# Patient Record
Sex: Male | Born: 1960
Health system: Southern US, Community
[De-identification: ages and names within clinical notes are randomized; demographics above are authoritative.]

## PROBLEM LIST (undated history)

## (undated) DIAGNOSIS — I10 Essential (primary) hypertension: Secondary | ICD-10-CM

---

## 2005-07-11 ENCOUNTER — Ambulatory Visit: Payer: Self-pay | Admitting: Family Medicine

## 2007-04-20 DIAGNOSIS — R519 Headache, unspecified: Secondary | ICD-10-CM | POA: Insufficient documentation

## 2007-04-20 DIAGNOSIS — E785 Hyperlipidemia, unspecified: Secondary | ICD-10-CM | POA: Insufficient documentation

## 2007-04-20 DIAGNOSIS — R51 Headache: Secondary | ICD-10-CM | POA: Insufficient documentation

## 2007-04-20 DIAGNOSIS — I10 Essential (primary) hypertension: Secondary | ICD-10-CM | POA: Insufficient documentation

## 2012-08-24 ENCOUNTER — Encounter (HOSPITAL_COMMUNITY): Payer: Self-pay | Admitting: *Deleted

## 2012-08-24 ENCOUNTER — Emergency Department (HOSPITAL_COMMUNITY)
Admission: EM | Admit: 2012-08-24 | Discharge: 2012-08-24 | Disposition: A | Discharge status: Home / Self Care | Payer: Medicaid Other | Source: Home / Self Care | Attending: Emergency Medicine | Admitting: Emergency Medicine

## 2012-08-24 DIAGNOSIS — R51 Headache: Secondary | ICD-10-CM

## 2012-08-24 DIAGNOSIS — I10 Essential (primary) hypertension: Secondary | ICD-10-CM

## 2012-08-24 HISTORY — DX: Essential (primary) hypertension: I10

## 2012-08-24 MED ORDER — LISINOPRIL-HYDROCHLOROTHIAZIDE 20-12.5 MG PO TABS: 1.0000 | ORAL_TABLET | Freq: Every day | ORAL | Status: DC

## 2012-08-24 NOTE — ED Provider Notes (Signed)
Medical screening examination/treatment/procedure(s) were performed by non-physician practitioner and as supervising physician I was immediately available for consultation/collaboration.  Leslee Home, M.D.   Reuben Likes, MD 08/24/12 2136

## 2012-08-24 NOTE — ED Notes (Signed)
Pt  Reports  A  Headache      He  Says    He      Has  High  Blood  Pressure  And  Has  Not  Taken  Any meds  In over  1  Year  -  As  A  Matter  Of fact he  Says  He  Has  Not seen a  Dr  In  Over  1  Year         He  Reports  Some  Nausea   He  Is  Awake  And  Alert    pearla     Ambulated  With a  Steady  Fluid  Gait

## 2012-08-24 NOTE — ED Provider Notes (Signed)
History     CSN: [id number]  Arrival date & time 08/24/12  1043   First MD Initiated Contact with Patient 08/24/12 1302      Chief Complaint  Patient presents with  . Headache    (Consider location/radiation/quality/duration/timing/severity/associated sxs/prior treatment) HPI Comments: Pt reports hx of htn for several years, untreated for many years. Gets headaches occasionally that pt believes are related to his untreated htn, most recent started 3 days ago and is worse than usual. Has found a pcp but can't get an appt until beginning of March.  Here requesting bp meds.  Mother with hx htn, died at age 24. No other family hx heart disease, stroke, htn. Has felt occasionally slightly dizzy with this headache, none now, and occasionally mild nausea, no vomiting.   Patient is a 52 y.o. male presenting with headaches. The history is provided by the patient.  Headache Pain location:  Generalized Quality:  Dull Radiates to:  Does not radiate Severity currently:  6/10 Onset quality:  Gradual Duration:  3 days Timing:  Constant Chronicity:  Recurrent Similar to prior headaches: yes   Relieved by:  Nothing Worsened by:  Nothing tried Associated symptoms: dizziness and nausea   Associated symptoms: no blurred vision, no congestion, no ear pain, no fever, no near-syncope, no neck pain, no neck stiffness, no numbness, no photophobia, no sinus pressure, no sore throat, no swollen glands, no syncope, no URI, no visual change, no vomiting and no weakness     Past Medical History  Diagnosis Date  . Hypertension     History reviewed. No pertinent past surgical history.  History reviewed. No pertinent family history.  History  Substance Use Topics  . Smoking status: Never Smoker   . Smokeless tobacco: Not on file  . Alcohol Use: No      Review of Systems  Constitutional: Negative for fever and chills.  HENT: Negative for ear pain, congestion, sore throat, neck pain, neck  stiffness and sinus pressure.   Eyes: Negative for blurred vision, photophobia and visual disturbance.  Respiratory: Negative for chest tightness and shortness of breath.   Cardiovascular: Negative for chest pain, syncope and near-syncope.  Gastrointestinal: Positive for nausea. Negative for vomiting.  Neurological: Positive for dizziness and headaches. Negative for syncope, speech difficulty, weakness and numbness.  Hematological: Does not bruise/bleed easily.  Psychiatric/Behavioral: Negative for confusion.    Allergies  Review of patient's allergies indicates no known allergies.  Home Medications   Current Outpatient Rx  Name  Route  Sig  Dispense  Refill  . lisinopril-hydrochlorothiazide (PRINZIDE,ZESTORETIC) 20-12.5 MG per tablet   Oral   Take 1 tablet by mouth daily.   30 tablet   1     BP 190/124  Pulse 76  Temp(Src) 98.6 F (37 C) (Oral)  SpO2 100%  Physical Exam  Constitutional: He is oriented to person, place, and time. He appears well-developed and well-nourished. No distress.  Neck: Normal range of motion. Neck supple. Normal carotid pulses and no JVD present. Carotid bruit is not present.  Cardiovascular: Normal rate and regular rhythm.  PMI is not displaced.   Pulses:      Radial pulses are 2+ on the right side, and 2+ on the left side.  No peripheral edema  Pulmonary/Chest: Effort normal and breath sounds normal.  Neurological: He is alert and oriented to person, place, and time. Coordination and gait normal.    ED Course  Procedures (including critical care time)  Labs Reviewed - No  data to display No results found.   1. Hypertension   2. Headache       MDM  Discussed signs/sx of stroke and MI with pt; pt to seek emergent help if these sx occur. Pt to f/u with Dr. Parke Simmers as scheduled.         Cathlyn Parsons, NP 08/24/12 1311

## 2014-11-18 ENCOUNTER — Encounter (HOSPITAL_COMMUNITY): Payer: Self-pay | Admitting: Emergency Medicine

## 2014-11-18 ENCOUNTER — Emergency Department (INDEPENDENT_AMBULATORY_CARE_PROVIDER_SITE_OTHER): Payer: Medicaid Other

## 2014-11-18 ENCOUNTER — Emergency Department (HOSPITAL_COMMUNITY)
Admission: EM | Admit: 2014-11-18 | Discharge: 2014-11-18 | Disposition: A | Discharge status: Home / Self Care | Payer: Medicaid Other | Attending: Emergency Medicine | Admitting: Emergency Medicine

## 2014-11-18 ENCOUNTER — Encounter (HOSPITAL_COMMUNITY): Payer: Self-pay | Admitting: *Deleted

## 2014-11-18 ENCOUNTER — Emergency Department (INDEPENDENT_AMBULATORY_CARE_PROVIDER_SITE_OTHER)
Admission: EM | Admit: 2014-11-18 | Discharge: 2014-11-18 | Disposition: A | Discharge status: Nursing Home (Includes ICF) | Payer: Medicaid Other | Source: Home / Self Care | Attending: Family Medicine | Admitting: Family Medicine

## 2014-11-18 DIAGNOSIS — Y92000 Kitchen of unspecified non-institutional (private) residence as  the place of occurrence of the external cause: Secondary | ICD-10-CM | POA: Diagnosis not present

## 2014-11-18 DIAGNOSIS — Y999 Unspecified external cause status: Secondary | ICD-10-CM | POA: Insufficient documentation

## 2014-11-18 DIAGNOSIS — Z23 Encounter for immunization: Secondary | ICD-10-CM

## 2014-11-18 DIAGNOSIS — S90851A Superficial foreign body, right foot, initial encounter: Secondary | ICD-10-CM

## 2014-11-18 DIAGNOSIS — S90852A Superficial foreign body, left foot, initial encounter: Secondary | ICD-10-CM | POA: Insufficient documentation

## 2014-11-18 DIAGNOSIS — Z79899 Other long term (current) drug therapy: Secondary | ICD-10-CM | POA: Insufficient documentation

## 2014-11-18 DIAGNOSIS — S99922A Unspecified injury of left foot, initial encounter: Secondary | ICD-10-CM | POA: Diagnosis present

## 2014-11-18 DIAGNOSIS — W458XXA Other foreign body or object entering through skin, initial encounter: Secondary | ICD-10-CM | POA: Diagnosis not present

## 2014-11-18 DIAGNOSIS — I10 Essential (primary) hypertension: Secondary | ICD-10-CM | POA: Insufficient documentation

## 2014-11-18 DIAGNOSIS — M795 Residual foreign body in soft tissue: Secondary | ICD-10-CM

## 2014-11-18 DIAGNOSIS — Y939 Activity, unspecified: Secondary | ICD-10-CM | POA: Insufficient documentation

## 2014-11-18 MED ORDER — HYDROCODONE-ACETAMINOPHEN 5-325 MG PO TABS
1.0000 | ORAL_TABLET | Freq: Four times a day (QID) | ORAL | Status: DC | PRN
Start: 2014-11-18 — End: 2015-01-02

## 2014-11-18 MED ORDER — LIDOCAINE HCL 2 % IJ SOLN
20.0000 mL | Freq: Once | INTRAMUSCULAR | Status: AC
  Administered 2014-11-18: 400 mg via INTRADERMAL
  Filled 2014-11-18: qty 20

## 2014-11-18 MED ORDER — AMOXICILLIN-POT CLAVULANATE 875-125 MG PO TABS: 1.0000 | ORAL_TABLET | Freq: Two times a day (BID) | ORAL | Status: DC

## 2014-11-18 MED ORDER — CIPROFLOXACIN HCL 500 MG PO TABS: 500.0000 mg | ORAL_TABLET | Freq: Two times a day (BID) | ORAL | Status: DC

## 2014-11-18 MED ORDER — TETANUS-DIPHTH-ACELL PERTUSSIS 5-2.5-18.5 LF-MCG/0.5 IM SUSP
0.5000 mL | Freq: Once | INTRAMUSCULAR | Status: AC
  Administered 2014-11-18: 0.5 mL via INTRAMUSCULAR

## 2014-11-18 MED ORDER — TETANUS-DIPHTH-ACELL PERTUSSIS 5-2.5-18.5 LF-MCG/0.5 IM SUSP
INTRAMUSCULAR | Status: AC
  Filled 2014-11-18: qty 0.5

## 2014-11-18 NOTE — ED Provider Notes (Signed)
CSN: [id number]     Arrival date & time 11/18/14  2010 History  This chart was scribed for non-physician practitioner Ebbie Ridgehris Kiaya Haliburton, PA, working with Samuel JesterKathleen McManus, DO, by Tanda RockersMargaux Venter, ED Scribe. This patient was seen in room TR07C/TR07C and the patient's care was started at 8:43 PM.    Chief Complaint  Patient presents with  . Foot Injury   The history is provided by the patient. No language interpreter was used.     HPI Comments: Thomas Avila is a 54 y.o. male who presents to the Emergency Department complaining of left foot injury that occurred 2 days ago. He states that he stepped on something sharp and tried to pull it out himself but was unable to do so. Pt was seen at urgent care earlier today and had an X ray done, showing a portion of a needle below the calcaneous. He admits to increased pain to the area. Pt denies any other symptoms.    Past Medical History  Diagnosis Date  . Hypertension    History reviewed. No pertinent past surgical history. No family history on file. History  Substance Use Topics  . Smoking status: Never Smoker   . Smokeless tobacco: Not on file  . Alcohol Use: Yes     Comment: occasionally    Review of Systems  A complete 10 system review of systems was obtained and all systems are negative except as noted in the HPI and PMH.     Allergies  Review of patient's allergies indicates no known allergies.  Home Medications   Prior to Admission medications   Medication Sig Start Date End Date Taking? Authorizing Provider  lisinopril-hydrochlorothiazide (PRINZIDE,ZESTORETIC) 20-12.5 MG per tablet Take 1 tablet by mouth daily. 08/24/12   Cathlyn ParsonsAngela M Kabbe, NP   Triage Vitals: BP 168/122 mmHg  Pulse 67  Temp(Src) 98 F (36.7 C) (Oral)  Resp 16  Ht 5\' 8"  (1.727 m)  Wt 182 lb (82.555 kg)  BMI 27.68 kg/m2  SpO2 99%   Physical Exam  Constitutional: He is oriented to person, place, and time. He appears well-developed and well-nourished. No  distress.  HENT:  Head: Normocephalic and atraumatic.  Pulmonary/Chest: Effort normal.  Musculoskeletal: Normal range of motion.  Small puncture wound mid calcaneous region.   Neurological: He is alert and oriented to person, place, and time.  Skin: Skin is warm and dry.  Psychiatric: He has a normal mood and affect. His behavior is normal.  Nursing note and vitals reviewed.   ED Course  FOREIGN BODY REMOVAL Date/Time: 11/20/2014 12:42 AM Performed by: Charlestine NightLAWYER, Noura Purpura Authorized by: Charlestine NightLAWYER, [place] Consent: Verbal consent obtained. Written consent not obtained. Risks and benefits: risks, benefits and alternatives were discussed Consent given by: patient Patient understanding: patient states understanding of the procedure being performed Patient consent: the patient's understanding of the procedure matches consent given Procedure consent: procedure consent matches procedure scheduled Relevant documents: relevant documents present and verified Test results: test results available and properly labeled Site marked: the operative site was marked Imaging studies: imaging studies available Patient identity confirmed: verbally with patient Time out: Immediately prior to procedure a "time out" was called to verify the correct patient, procedure, equipment, support staff and site/side marked as required. Body area: skin General location: lower extremity Location details: left foot Anesthesia: local infiltration Local anesthetic: lidocaine 2% without epinephrine Anesthetic total: 8 ml Patient sedated: no Patient restrained: no Patient cooperative: yes Localization method: C-arm Removal mechanism: alligator forceps, scalpel, forceps and hemostat Dressing:  dressing applied and antibiotic ointment Tendon involvement: none Depth: deep Complexity: complex 1 objects recovered. Objects recovered: Sewing needle Post-procedure assessment: foreign body removed Patient tolerance:  Patient tolerated the procedure well with no immediate complications Comments: The patient was prepped and cleaned in a sterile fashion.  The wound was then copiously irrigated following the removal of the needle.  Patient was placed on antibiotics   (including critical care time)  DIAGNOSTIC STUDIES: Oxygen Saturation is 99% on RA, normal by my interpretation.    COORDINATION OF CARE: 8:46 PM-Discussed treatment plan which includes foreign body removal with pt at bedside and pt agreed to plan.   Imaging Review Dg Foot Complete Left  11/18/2014   CLINICAL DATA:  Per pt: stepped on fish bone Wednesday. Tried to take it out, couldn't get it. Injury is to the left foot. 2 AP left foot performed, one with a metallic BB marker of wound entry, second without. Patient is not a diabetic  EXAM: LEFT FOOT - COMPLETE 3+ VIEW  COMPARISON:  None.  FINDINGS: There is no evidence of a fish bone. However, there is a portion of a needle within the soft tissues of the posterior plantar foot, just below the body of the calcaneus.  No other radiopaque foreign body.  No soft tissue air.  No fracture. No evidence of osteomyelitis. Joints are normally spaced and aligned. There is a small marginal erosion of the lateral head of the fifth metacarpal. Consider gout in the proper clinical setting. Minor spurring is noted at the first metatarsophalangeal joint. No other arthropathic change.  IMPRESSION: 1. No fracture.  No evidence of osteomyelitis. 2. Portion of a needle lies within the posterior plantar soft tissues below the body of the calcaneus. No other radiopaque foreign body.   Electronically Signed   By: Amie Portlandavid  Ormond M.D.   On: 11/18/2014 18:10   patient was placed on antibiotics.  Told to keep the area clean and dry.  Advised to soak in warm water with peroxide 3 times a day.  Told to here for any worsening in his condition such infection I personally performed the services described in this documentation, which was  scribed in my presence. The recorded information has been reviewed and is accurate.    Charlestine Nighthristopher Shaheen Star, PA-C 11/20/14 0046  Samuel JesterKathleen McManus, DO 11/20/14 1615

## 2014-11-18 NOTE — ED Provider Notes (Signed)
CSN: [id number]     Arrival date & time 11/18/14  1608 History   First MD Initiated Contact with Patient 11/18/14 1708     Chief Complaint  Patient presents with  . Foot Injury   (Consider location/radiation/quality/duration/timing/severity/associated sxs/prior Treatment) Patient is a 54 y.o. male presenting with foot injury. The history is provided by the patient.  Foot Injury Location:  Foot Time since incident:  2 days Injury: yes   Mechanism of injury comment:  Concerned with stepping on fishbone to heel area, still sore to walk on it. Foot location:  Sole of L foot Pain details:    Radiates to:  Does not radiate   Severity:  Moderate   Progression:  Worsening Chronicity:  New Dislocation: no   Associated symptoms: no decreased ROM and no fever     Past Medical History  Diagnosis Date  . Hypertension    History reviewed. No pertinent past surgical history. History reviewed. No pertinent family history. History  Substance Use Topics  . Smoking status: Never Smoker   . Smokeless tobacco: Not on file  . Alcohol Use: Yes     Comment: occasionally    Review of Systems  Constitutional: Negative.  Negative for fever.  Musculoskeletal: Positive for gait problem.  Skin: Positive for wound.    Allergies  Review of patient's allergies indicates no known allergies.  Home Medications   Prior to Admission medications   Medication Sig Start Date End Date Taking? Authorizing Provider  lisinopril-hydrochlorothiazide (PRINZIDE,ZESTORETIC) 20-12.5 MG per tablet Take 1 tablet by mouth daily. 08/24/12   Cathlyn ParsonsAngela M Kabbe, NP   BP 171/118 mmHg  Pulse 70  Temp(Src) 98.2 F (36.8 C) (Oral)  Resp 16  SpO2 97% Physical Exam  Constitutional: He is oriented to person, place, and time. He appears well-developed and well-nourished. No distress.  Musculoskeletal: He exhibits tenderness.  Puncture site evident mid calcaneas. No erythema, no sts evident.  Neurological: He is alert and  oriented to person, place, and time.  Skin: Skin is warm and dry. No rash noted.  Nursing note and vitals reviewed.   ED Course  Procedures (including critical care time) Labs Review Labs Reviewed - No data to display  Imaging Review Dg Foot Complete Left  11/18/2014   CLINICAL DATA:  Per pt: stepped on fish bone Wednesday. Tried to take it out, couldn't get it. Injury is to the left foot. 2 AP left foot performed, one with a metallic BB marker of wound entry, second without. Patient is not a diabetic  EXAM: LEFT FOOT - COMPLETE 3+ VIEW  COMPARISON:  None.  FINDINGS: There is no evidence of a fish bone. However, there is a portion of a needle within the soft tissues of the posterior plantar foot, just below the body of the calcaneus.  No other radiopaque foreign body.  No soft tissue air.  No fracture. No evidence of osteomyelitis. Joints are normally spaced and aligned. There is a small marginal erosion of the lateral head of the fifth metacarpal. Consider gout in the proper clinical setting. Minor spurring is noted at the first metatarsophalangeal joint. No other arthropathic change.  IMPRESSION: 1. No fracture.  No evidence of osteomyelitis. 2. Portion of a needle lies within the posterior plantar soft tissues below the body of the calcaneus. No other radiopaque foreign body.   Electronically Signed   By: Amie Portlandavid  Ormond M.D.   On: 11/18/2014 18:10   X-rays reviewed and report per radiologist.   MDM  1. Foreign body in foot, right, initial encounter    Sent for fb removal from left foot    Linna HoffJames D Jeshurun [place]aca, MD 11/18/14 2000

## 2014-11-18 NOTE — Discharge Instructions (Signed)
Return here as needed.  Soak your foot and warm water and soap twice a day.  Keep the area clean and dry.  Allow the wound to get air to it did not place it in a shoe

## 2014-11-18 NOTE — ED Notes (Signed)
Pt asked if he could eat. Pt advised to stay NPO until plan of care decided.

## 2014-11-18 NOTE — ED Notes (Addendum)
Pt states he stepped on something sharp in his kitchen floor on Wednesday.  [facility] and reports he has "a broken needle" in his L heel.

## 2014-11-18 NOTE — ED Notes (Signed)
Pt is here with complaints of left foot injury. Pt states he stepped on something on Wednesday.

## 2015-01-02 ENCOUNTER — Ambulatory Visit: Payer: Medicaid Other | Attending: Internal Medicine | Admitting: Internal Medicine

## 2015-01-02 ENCOUNTER — Ambulatory Visit: Payer: Medicaid Other | Admitting: Internal Medicine

## 2015-01-02 ENCOUNTER — Encounter: Payer: Self-pay | Admitting: Internal Medicine

## 2015-01-02 VITALS — BP 136/82 | HR 75 | Temp 98.2°F | Resp 18 | Ht 69.25 in | Wt 186.4 lb

## 2015-01-02 DIAGNOSIS — Z7189 Other specified counseling: Secondary | ICD-10-CM | POA: Diagnosis not present

## 2015-01-02 DIAGNOSIS — Z8679 Personal history of other diseases of the circulatory system: Secondary | ICD-10-CM

## 2015-01-02 DIAGNOSIS — Z7689 Persons encountering health services in other specified circumstances: Secondary | ICD-10-CM

## 2015-01-02 DIAGNOSIS — I10 Essential (primary) hypertension: Secondary | ICD-10-CM | POA: Diagnosis not present

## 2015-01-02 LAB — CBC WITH DIFFERENTIAL/PLATELET
BASOS PCT: 1 % (ref 0–1)
Basophils Absolute: 0 10*3/uL (ref 0.0–0.1)
EOS PCT: 1 % (ref 0–5)
Eosinophils Absolute: 0 10*3/uL (ref 0.0–0.7)
HCT: 43.6 % (ref 39.0–52.0)
HEMOGLOBIN: 14.3 g/dL (ref 13.0–17.0)
LYMPHS ABS: 1.6 10*3/uL (ref 0.7–4.0)
LYMPHS PCT: 45 % (ref 12–46)
MCH: 26.6 pg (ref 26.0–34.0)
MCHC: 32.8 g/dL (ref 30.0–36.0)
MCV: 81.2 fL (ref 78.0–100.0)
MPV: 9.2 fL (ref 8.6–12.4)
Monocytes Absolute: 0.2 10*3/uL (ref 0.1–1.0)
Monocytes Relative: 6 % (ref 3–12)
NEUTROS ABS: 1.7 10*3/uL (ref 1.7–7.7)
NEUTROS PCT: 47 % (ref 43–77)
Platelets: 163 10*3/uL (ref 150–400)
RBC: 5.37 MIL/uL (ref 4.22–5.81)
RDW: 15.1 % (ref 11.5–15.5)
WBC: 3.6 10*3/uL — AB (ref 4.0–10.5)

## 2015-01-02 LAB — COMPLETE METABOLIC PANEL WITH GFR
ALBUMIN: 4.5 g/dL (ref 3.5–5.2)
ALK PHOS: 79 U/L (ref 39–117)
ALT: 21 U/L (ref 0–53)
AST: 24 U/L (ref 0–37)
BUN: 15 mg/dL (ref 6–23)
CALCIUM: 9.7 mg/dL (ref 8.4–10.5)
CO2: 28 mEq/L (ref 19–32)
CREATININE: 1.45 mg/dL — AB (ref 0.50–1.35)
Chloride: 103 mEq/L (ref 96–112)
GFR, EST NON AFRICAN AMERICAN: 54 mL/min — AB
GFR, Est African American: 63 mL/min
GLUCOSE: 76 mg/dL (ref 70–99)
POTASSIUM: 4.6 meq/L (ref 3.5–5.3)
SODIUM: 143 meq/L (ref 135–145)
Total Bilirubin: 0.6 mg/dL (ref 0.2–1.2)
Total Protein: 7.2 g/dL (ref 6.0–8.3)

## 2015-01-02 LAB — LIPID PANEL
CHOLESTEROL: 221 mg/dL — AB (ref 0–200)
HDL: 51 mg/dL (ref >=40)
LDL Cholesterol: 134 mg/dL — ABNORMAL HIGH (ref 0–99)
TRIGLYCERIDES: 180 mg/dL — AB (ref <150)
Total CHOL/HDL Ratio: 4.3 Ratio
VLDL: 36 mg/dL (ref 0–40)

## 2015-01-02 LAB — POCT GLYCOSYLATED HEMOGLOBIN (HGB A1C): Hemoglobin A1C: 5.6

## 2015-01-02 LAB — TSH: TSH: 1.373 u[IU]/mL (ref 0.350–4.500)

## 2015-01-02 MED ORDER — HYDROCHLOROTHIAZIDE 25 MG PO TABS: 25.0000 mg | ORAL_TABLET | Freq: Every day | ORAL | Status: DC
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
## 2015-01-02 NOTE — Patient Instructions (Signed)
Hypertension Hypertension, commonly called high blood pressure, is when the force of blood pumping through your arteries is too strong. Your arteries are the blood vessels that carry blood from your heart throughout your body. A blood pressure reading consists of a higher number over a lower number, such as 110/72. The higher number (systolic) is the pressure inside your arteries when your heart pumps. The lower number (diastolic) is the pressure inside your arteries when your heart relaxes. Ideally you want your blood pressure below 120/80. Hypertension forces your heart to work harder to pump blood. Your arteries may become narrow or stiff. Having hypertension puts you at risk for heart disease, stroke, and other problems.  RISK FACTORS Some risk factors for high blood pressure are controllable. Others are not.  Risk factors you cannot control include:   Race. You may be at higher risk if you are African American.  Age. Risk increases with age.  Gender. Men are at higher risk than women before age 45 years. After age 65, women are at higher risk than men. Risk factors you can control include:  Not getting enough exercise or physical activity.  Being overweight.  Getting too much fat, sugar, calories, or salt in your diet.  Drinking too much alcohol. SIGNS AND SYMPTOMS Hypertension does not usually cause signs or symptoms. Extremely high blood pressure (hypertensive crisis) may cause headache, anxiety, shortness of breath, and nosebleed. DIAGNOSIS  To check if you have hypertension, your health care provider will measure your blood pressure while you are seated, with your arm held at the level of your heart. It should be measured at least twice using the same arm. Certain conditions can cause a difference in blood pressure between your right and left arms. A blood pressure reading that is higher than normal on one occasion does not mean that you need treatment. If one blood pressure reading  is high, ask your health care provider about having it checked again. TREATMENT  Treating high blood pressure includes making lifestyle changes and possibly taking medicine. Living a healthy lifestyle can help lower high blood pressure. You may need to change some of your habits. Lifestyle changes may include:  Following the DASH diet. This diet is high in fruits, vegetables, and whole grains. It is low in salt, red meat, and added sugars.  Getting at least 2 hours of brisk physical activity every week.  Losing weight if necessary.  Not smoking.  Limiting alcoholic beverages.  Learning ways to reduce stress. If lifestyle changes are not enough to get your blood pressure under control, your health care provider may prescribe medicine. You may need to take more than one. Work closely with your health care provider to understand the risks and benefits. HOME CARE INSTRUCTIONS  Have your blood pressure rechecked as directed by your health care provider.   Take medicines only as directed by your health care provider. Follow the directions carefully. Blood pressure medicines must be taken as prescribed. The medicine does not work as well when you skip doses. Skipping doses also puts you at risk for problems.   Do not smoke.   Monitor your blood pressure at home as directed by your health care provider. SEEK MEDICAL CARE IF:   You think you are having a reaction to medicines taken.  You have recurrent headaches or feel dizzy.  You have swelling in your ankles.  You have trouble with your vision. SEEK IMMEDIATE MEDICAL CARE IF:  You develop a severe headache or confusion.    You have unusual weakness, numbness, or feel faint.  You have severe chest or abdominal pain.  You vomit repeatedly.  You have trouble breathing. MAKE SURE YOU:   Understand these instructions.  Will watch your condition.  Will get help right away if you are not doing well or get worse. Document  Released: 07/01/2005 Document Revised: 11/15/2013 Document Reviewed: 04/23/2013 ExitCare Patient Information 2015 [employer]. This information is not intended to replace advice given to you by your health care provider. Make sure you discuss any questions you have with your health care provider. DASH Eating Plan DASH stands for "Dietary Approaches to Stop Hypertension." The DASH eating plan is a healthy eating plan that has been shown to reduce high blood pressure (hypertension). Additional health benefits may include reducing the risk of type 2 diabetes mellitus, heart disease, and stroke. The DASH eating plan may also help with weight loss. WHAT DO I NEED TO KNOW ABOUT THE DASH EATING PLAN? For the DASH eating plan, you will follow these general guidelines:  Choose foods with a percent daily value for sodium of less than 5% (as listed on the food label).  Use salt-free seasonings or herbs instead of table salt or sea salt.  Check with your health care provider or pharmacist before using salt substitutes.  Eat lower-sodium products, often labeled as "lower sodium" or "no salt added."  Eat fresh foods.  Eat more vegetables, fruits, and low-fat dairy products.  Choose whole grains. Look for the word "whole" as the first word in the ingredient list.  Choose fish and skinless chicken or turkey more often than red meat. Limit fish, poultry, and meat to 6 oz (170 g) each day.  Limit sweets, desserts, sugars, and sugary drinks.  Choose heart-healthy fats.  Limit cheese to 1 oz (28 g) per day.  Eat more home-cooked food and less restaurant, buffet, and fast food.  Limit fried foods.  Cook foods using methods other than frying.  Limit canned vegetables. If you do use them, rinse them well to decrease the sodium.  When eating at a restaurant, ask that your food be prepared with less salt, or no salt if possible. WHAT FOODS CAN I EAT? Seek help from a dietitian for individual  calorie needs. Grains Whole grain or whole wheat bread. Brown rice. Whole grain or whole wheat pasta. Quinoa, bulgur, and whole grain cereals. Low-sodium cereals. Corn or whole wheat flour tortillas. Whole grain cornbread. Whole grain crackers. Low-sodium crackers. Vegetables Fresh or frozen vegetables (raw, steamed, roasted, or grilled). Low-sodium or reduced-sodium tomato and vegetable juices. Low-sodium or reduced-sodium tomato sauce and paste. Low-sodium or reduced-sodium canned vegetables.  Fruits All fresh, canned (in natural juice), or frozen fruits. Meat and Other Protein Products Ground beef (85% or leaner), grass-fed beef, or beef trimmed of fat. Skinless chicken or turkey. Ground chicken or turkey. Pork trimmed of fat. All fish and seafood. Eggs. Dried beans, peas, or lentils. Unsalted nuts and seeds. Unsalted canned beans. Dairy Low-fat dairy products, such as skim or 1% milk, 2% or reduced-fat cheeses, low-fat ricotta or cottage cheese, or plain low-fat yogurt. Low-sodium or reduced-sodium cheeses. Fats and Oils Tub margarines without trans fats. Light or reduced-fat mayonnaise and salad dressings (reduced sodium). Avocado. Safflower, olive, or canola oils. Natural peanut or almond butter. Other Unsalted popcorn and pretzels. The items listed above may not be a complete list of recommended foods or beverages. Contact your dietitian for more options. WHAT FOODS ARE NOT RECOMMENDED? Grains White bread.   White pasta. White rice. Refined cornbread. Bagels and croissants. Crackers that contain trans fat. Vegetables Creamed or fried vegetables. Vegetables in a cheese sauce. Regular canned vegetables. Regular canned tomato sauce and paste. Regular tomato and vegetable juices. Fruits Dried fruits. Canned fruit in light or heavy syrup. Fruit juice. Meat and Other Protein Products Fatty cuts of meat. Ribs, chicken wings, bacon, sausage, bologna, salami, chitterlings, fatback, hot dogs,  bratwurst, and packaged luncheon meats. Salted nuts and seeds. Canned beans with salt. Dairy Whole or 2% milk, cream, half-and-half, and cream cheese. Whole-fat or sweetened yogurt. Full-fat cheeses or blue cheese. Nondairy creamers and whipped toppings. Processed cheese, cheese spreads, or cheese curds. Condiments Onion and garlic salt, seasoned salt, table salt, and sea salt. Canned and packaged gravies. Worcestershire sauce. Tartar sauce. Barbecue sauce. Teriyaki sauce. Soy sauce, including reduced sodium. Steak sauce. Fish sauce. Oyster sauce. Cocktail sauce. Horseradish. Ketchup and mustard. Meat flavorings and tenderizers. Bouillon cubes. Hot sauce. Tabasco sauce. Marinades. Taco seasonings. Relishes. Fats and Oils Butter, stick margarine, lard, shortening, ghee, and bacon fat. Coconut, palm kernel, or palm oils. Regular salad dressings. Other Pickles and olives. Salted popcorn and pretzels. The items listed above may not be a complete list of foods and beverages to avoid. Contact your dietitian for more information. WHERE CAN I FIND MORE INFORMATION? National Heart, Lung, and Blood Institute: www.nhlbi.nih.gov/health/health-topics/topics/dash/ Document Released: 06/20/2011 Document Revised: 11/15/2013 Document Reviewed: 05/05/2013 ExitCare Patient Information 2015 [employer]. This information is not intended to replace advice given to you by your health care provider. Make sure you discuss any questions you have with your health care provider.  

## 2015-01-02 NOTE — Progress Notes (Signed)
Patient is here to establish care and for HTN. Patient reports that he feels fine today. Patient has not been taking BP medications for almost 2 years now. Patient would like to receive blood work today. Patient BP 136/82

## 2015-01-03 LAB — URINALYSIS, COMPLETE
BACTERIA UA: NONE SEEN
BILIRUBIN URINE: NEGATIVE
CASTS: NONE SEEN
CRYSTALS: NONE SEEN
Glucose, UA: NEGATIVE mg/dL
Hgb urine dipstick: NEGATIVE
KETONES UR: NEGATIVE mg/dL
Leukocytes, UA: NEGATIVE
NITRITE: NEGATIVE
PH: 5.5 (ref 5.0–8.0)
Protein, ur: NEGATIVE mg/dL
SPECIFIC GRAVITY, URINE: 1.021 (ref 1.005–1.030)
SQUAMOUS EPITHELIAL / LPF: NONE SEEN
Urobilinogen, UA: 0.2 mg/dL (ref 0.0–1.0)

## 2015-01-17 ENCOUNTER — Telehealth: Payer: Self-pay | Admitting: *Deleted

## 2015-01-17 NOTE — Telephone Encounter (Signed)
Left HIPAA compliant message for patient to return my call. 

## 2015-01-17 NOTE — Telephone Encounter (Signed)
-----   Message from Quentin Angstlugbemiga E Jegede, MD sent at 01/06/2015  5:35 PM EDT ----- Please inform patient that his laboratory test results shows mildly reduced kidney function which could be from dehydration or from effect of hypertension on the kidneys, also cholesterol level is high and to address this please limit saturated fat to no more than 7% of your calories, limit cholesterol to 200 mg/day, increase fiber and exercise as tolerated. If needed we may start cholesterol lowering medication. Other results are normal.

## 2015-02-16 ENCOUNTER — Telehealth: Payer: Self-pay | Admitting: Family Medicine

## 2015-02-16 NOTE — Telephone Encounter (Signed)
Pt is interested in a referral to see an eye specialist. Pt saw pcp recently in June. Please follow up with pt if referral can be placed without having to make another appt for pt. Thank you.

## 2015-02-16 NOTE — Telephone Encounter (Signed)
Pt has medicaid I mail him a Pension scheme manager that accept [facility] [employer]

## 2015-02-21 ENCOUNTER — Ambulatory Visit: Payer: Medicaid Other | Attending: Family Medicine | Admitting: Pharmacist

## 2015-02-21 VITALS — BP 121/83

## 2015-02-21 DIAGNOSIS — Z8679 Personal history of other diseases of the circulatory system: Secondary | ICD-10-CM | POA: Diagnosis not present

## 2015-02-21 DIAGNOSIS — I10 Essential (primary) hypertension: Secondary | ICD-10-CM | POA: Diagnosis present

## 2015-02-21 NOTE — Progress Notes (Signed)
Patient arrives in good spirits.    He presents to the clinic for hypertension evaluation.   Patient reports adherence with medications.  Current BP Medications include:  HCTZ 25 mg daily   Antihypertensives tried in the past include: N/A  SM BP:  Reports 130s-140s/80s-90s   O:   Last 3 Office BP readings: BP Readings from Last 3 Encounters:  02/21/15 121/83  01/02/15 136/82  11/18/14 168/122    BMET    Component Value Date/Time   NA 143 01/02/2015 1607   K 4.6 01/02/2015 1607   CL 103 01/02/2015 1607   CO2 28 01/02/2015 1607   GLUCOSE 76 01/02/2015 1607   BUN 15 01/02/2015 1607   CREATININE 1.45* 01/02/2015 1607   CALCIUM 9.7 01/02/2015 1607   GFRNONAA 54* 01/02/2015 1607   GFRAA 63 01/02/2015 1607    A/P:  History of hypertension since 2016 which currently is controlled on current medications.  Continue current medications. Discussed DASH diet and increasing exercise with pt. Pts renal fxn slightly decreased with SCr 1.45 and CrCl 70.  Will continue to monitor.    HLD: TC 221, LDL 134, HDL 51.  ASCVD 10 year risk 5.9%. Discussed diet with low saturated fat and ways to increase HDL cholesterol.  Will continue to monitor cholesterol and consider addition of Statin therapy at next visit.    Results reviewed and written information provided.   F/U Clinic Visit with Dr. Hyman Hopes.  Total time in face-to-face counseling 30 minutes.  Patient seen with Hazle Nordmann, PharmD, Pharmacy Resident

## 2015-02-21 NOTE — Patient Instructions (Signed)
Thank you for coming in today Please continue to take your medications   Fat and Cholesterol Control Diet Fat and cholesterol levels in your blood and organs are influenced by your diet. High levels of fat and cholesterol may lead to diseases of the heart, small and large blood vessels, gallbladder, liver, and pancreas. CONTROLLING FAT AND CHOLESTEROL WITH DIET Although exercise and lifestyle factors are important, your diet is key. That is because certain foods are known to raise cholesterol and others to lower it. The goal is to balance foods for their effect on cholesterol and more importantly, to replace saturated and trans fat with other types of fat, such as monounsaturated fat, polyunsaturated fat, and omega-3 fatty acids. On average, a person should consume no more than 15 to 17 g of saturated fat daily. Saturated and trans fats are considered "bad" fats, and they will raise LDL cholesterol. Saturated fats are primarily found in animal products such as meats, butter, and cream. However, that does not mean you need to give up all your favorite foods. Today, there are good tasting, low-fat, low-cholesterol substitutes for most of the things you like to eat. Choose low-fat or nonfat alternatives. Choose round or loin cuts of red meat. These types of cuts are lowest in fat and cholesterol. Chicken (without the skin), fish, veal, and ground [place] breast are great choices. Eliminate fatty meats, such as hot dogs and salami. Even shellfish have little or no saturated fat. Have a 3 oz (85 g) portion when you eat lean meat, poultry, or fish. Trans fats are also called "partially hydrogenated oils." They are oils that have been scientifically manipulated so that they are solid at room temperature resulting in a longer shelf life and improved taste and texture of foods in which they are added. Trans fats are found in stick margarine, some tub margarines, cookies, crackers, and baked goods.  When baking and  cooking, oils are a great substitute for butter. The monounsaturated oils are especially beneficial since it is believed they lower LDL and raise HDL. The oils you should avoid entirely are saturated tropical oils, such as coconut and palm.  Remember to eat a lot from food groups that are naturally free of saturated and trans fat, including fish, fruit, vegetables, beans, grains (barley, rice, couscous, bulgur wheat), and pasta (without cream sauces).  IDENTIFYING FOODS THAT LOWER FAT AND CHOLESTEROL  Soluble fiber may lower your cholesterol. This type of fiber is found in fruits such as apples, vegetables such as broccoli, potatoes, and carrots, legumes such as beans, peas, and lentils, and grains such as barley. Foods fortified with plant sterols (phytosterol) may also lower cholesterol. You should eat at least 2 g per day of these foods for a cholesterol lowering effect.  Read package labels to identify low-saturated fats, trans fat free, and low-fat foods at the supermarket. Select cheeses that have only 2 to 3 g saturated fat per ounce. Use a heart-healthy tub margarine that is free of trans fats or partially hydrogenated oil. When buying baked goods (cookies, crackers), avoid partially hydrogenated oils. Breads and muffins should be made from whole grains (whole-wheat or whole oat flour, instead of "flour" or "enriched flour"). Buy non-creamy canned soups with reduced salt and no added fats.  FOOD PREPARATION TECHNIQUES  Never deep-fry. If you must fry, either stir-fry, which uses very little fat, or use non-stick cooking sprays. When possible, broil, bake, or roast meats, and steam vegetables. Instead of putting butter or margarine on vegetables,  use lemon and herbs, applesauce, and cinnamon (for squash and sweet potatoes). Use nonfat yogurt, salsa, and low-fat dressings for salads.  LOW-SATURATED FAT / LOW-FAT FOOD SUBSTITUTES Meats / Saturated Fat (g)  Avoid: Steak, marbled (3 oz/85 g) / 11  g  Choose: Steak, lean (3 oz/85 g) / 4 g  Avoid: Hamburger (3 oz/85 g) / 7 g  Choose: Hamburger, lean (3 oz/85 g) / 5 g  Avoid: Ham (3 oz/85 g) / 6 g  Choose: Ham, lean cut (3 oz/85 g) / 2.4 g  Avoid: Chicken, with skin, dark meat (3 oz/85 g) / 4 g  Choose: Chicken, skin removed, dark meat (3 oz/85 g) / 2 g  Avoid: Chicken, with skin, light meat (3 oz/85 g) / 2.5 g  Choose: Chicken, skin removed, light meat (3 oz/85 g) / 1 g Dairy / Saturated Fat (g)  Avoid: Whole milk (1 cup) / 5 g  Choose: Low-fat milk, 2% (1 cup) / 3 g  Choose: Low-fat milk, 1% (1 cup) / 1.5 g  Choose: Skim milk (1 cup) / 0.3 g  Avoid: Hard cheese (1 oz/28 g) / 6 g  Choose: Skim milk cheese (1 oz/28 g) / 2 to 3 g  Avoid: Cottage cheese, 4% fat (1 cup) / 6.5 g  Choose: Low-fat cottage cheese, 1% fat (1 cup) / 1.5 g  Avoid: Ice cream (1 cup) / 9 g  Choose: Sherbet (1 cup) / 2.5 g  Choose: Nonfat frozen yogurt (1 cup) / 0.3 g  Choose: Frozen fruit bar / trace  Avoid: Whipped cream (1 tbs) / 3.5 g  Choose: Nondairy whipped topping (1 tbs) / 1 g Condiments / Saturated Fat (g)  Avoid: Mayonnaise (1 tbs) / 2 g  Choose: Low-fat mayonnaise (1 tbs) / 1 g  Avoid: Butter (1 tbs) / 7 g  Choose: Extra light margarine (1 tbs) / 1 g  Avoid: Coconut oil (1 tbs) / 11.8 g  Choose: Olive oil (1 tbs) / 1.8 g  Choose: Corn oil (1 tbs) / 1.7 g  Choose: Safflower oil (1 tbs) / 1.2 g  Choose: Sunflower oil (1 tbs) / 1.4 g  Choose: Soybean oil (1 tbs) / 2.4 g  Choose: Canola oil (1 tbs) / 1 g Document Released: 07/01/2005 Document Revised: 10/26/2012 Document Reviewed: 09/29/2013 ExitCare Patient Information 2015 [place], [place]. This information is not intended to replace advice given to you by your health care provider. Make sure you discuss any questions you have with your health care provider.

## 2015-03-02 ENCOUNTER — Encounter: Payer: Medicaid Other | Admitting: Internal Medicine

## 2015-03-10 ENCOUNTER — Encounter: Payer: Self-pay | Admitting: Internal Medicine

## 2015-03-10 ENCOUNTER — Ambulatory Visit: Payer: Medicaid Other | Attending: Internal Medicine | Admitting: Internal Medicine

## 2015-03-10 VITALS — BP 138/80 | HR 62 | Temp 98.8°F | Resp 16 | Ht 69.0 in | Wt 185.0 lb

## 2015-03-10 DIAGNOSIS — Z8679 Personal history of other diseases of the circulatory system: Secondary | ICD-10-CM | POA: Diagnosis not present

## 2015-03-10 DIAGNOSIS — H538 Other visual disturbances: Secondary | ICD-10-CM | POA: Diagnosis not present

## 2015-03-10 NOTE — Progress Notes (Signed)
Patient here for a regular check up on hypertension No complaints at this time

## 2015-03-10 NOTE — Progress Notes (Signed)
Patient ID: Thomas Avila, male   DOB: [birth year], 54 y.o.   MRN: [id number]  CC: follow up  HPI: Thomas Avila is a 54 y.o. male here today for a follow up visit.  Patient has past medical history of hypertension. Patient reports that he has been taking his blood pressure medication daily without complications. Patient denies symptoms of chest pain, palpitations, SOB, edema, or headaches. Patient reports that when he was born his birth year was written incorrectly as 1962 instead of [birth year].  He would like a Opthalmology referral due to having more difficulty with seeing while reading for the past 2 years.  No Known Allergies Past Medical History  Diagnosis Date  . Hypertension    Current Outpatient Prescriptions on File Prior to Visit  Medication Sig Dispense Refill  . hydrochlorothiazide (HYDRODIURIL) 25 MG tablet Take 1 tablet (25 mg total) by mouth daily. 90 tablet 3   No current facility-administered medications on file prior to visit.   Family History  Problem Relation Age of Onset  . Hypertension Mother    Social History   Social History  . Marital Status: Married    Spouse Name: N/A  . Number of Children: N/A  . Years of Education: N/A   Occupational History  . Not on file.   Social History Main Topics  . Smoking status: Never Smoker   . Smokeless tobacco: Not on file  . Alcohol Use: 0.0 oz/week    0 Standard drinks or equivalent per week     Comment: occasionally  . Drug Use: No  . Sexual Activity: Not on file   Other Topics Concern  . Not on file   Social History Narrative    Review of Systems: Other than what is stated in HPI, all other systems are negative.   Objective:   Filed Vitals:   03/10/15 1057  BP: 138/80  Pulse: 62  Temp: 98.8 F (37.1 C)  Resp: 16    Physical Exam  Constitutional: He is oriented to person, place, and time.  Eyes: EOM are normal. Pupils are equal, round, and reactive to light.  Cardiovascular: Normal rate,  regular rhythm and normal heart sounds.   Pulmonary/Chest: Effort normal and breath sounds normal.  Musculoskeletal: He exhibits no edema.  Neurological: He is alert and oriented to person, place, and time.  Psychiatric: He has a normal mood and affect.    Lab Results  Component Value Date   WBC 3.6* 01/02/2015   HGB 14.3 01/02/2015   HCT 43.6 01/02/2015   MCV 81.2 01/02/2015   PLT 163 01/02/2015   Lab Results  Component Value Date   CREATININE 1.45* 01/02/2015   BUN 15 01/02/2015   NA 143 01/02/2015   K 4.6 01/02/2015   CL 103 01/02/2015   CO2 28 01/02/2015    Lab Results  Component Value Date   HGBA1C 5.60 01/02/2015   Lipid Panel     Component Value Date/Time   CHOL 221* 01/02/2015 1607   TRIG 180* 01/02/2015 1607   HDL 51 01/02/2015 1607   CHOLHDL 4.3 01/02/2015 1607   VLDL 36 01/02/2015 1607   LDLCALC 134* 01/02/2015 1607       Assessment and plan:   Thomas was seen today for annual exam.  Diagnoses and all orders for this visit:  History of hypertension -Continue HCTZ, BP controlled.  Blurred vision -     Ambulatory referral to Ophthalmology   Return in about 3 months (around 06/10/2015) for Hypertension.  Ambrose Finland, NP-C [facility] [phone] 03/10/2015, 11:52 AM

## 2016-01-08 IMAGING — DX DG FOOT COMPLETE 3+V*L*
4 series · 4 of 4 positions shown · non-contrast
Comparison: None.

CLINICAL DATA: Per pt: stepped on fish [REDACTED]. Tried to
take it out, couldn't get it. Injury is to the left foot. 2 AP left
foot performed, one with a metallic BB marker of wound entry, second
without. Patient is not a diabetic

EXAM:
LEFT FOOT - COMPLETE 3+ VIEW

[foot ap (1 of 2)]
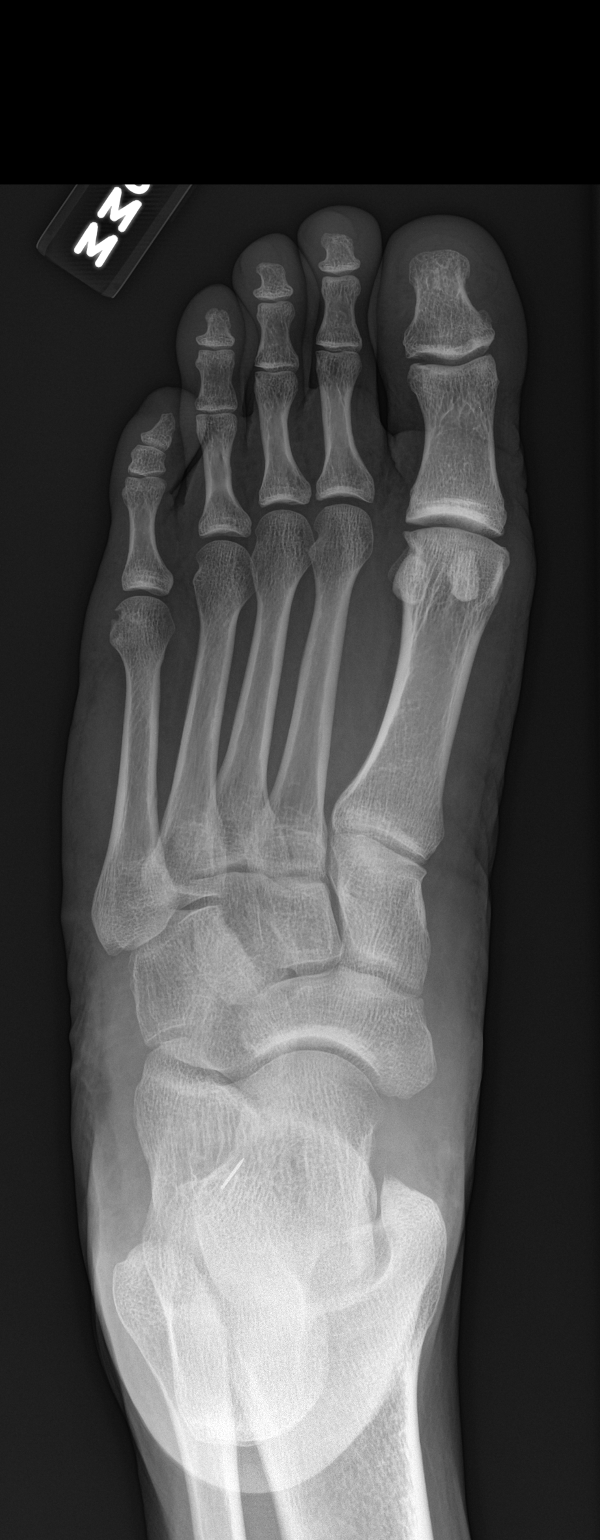

[foot obl]
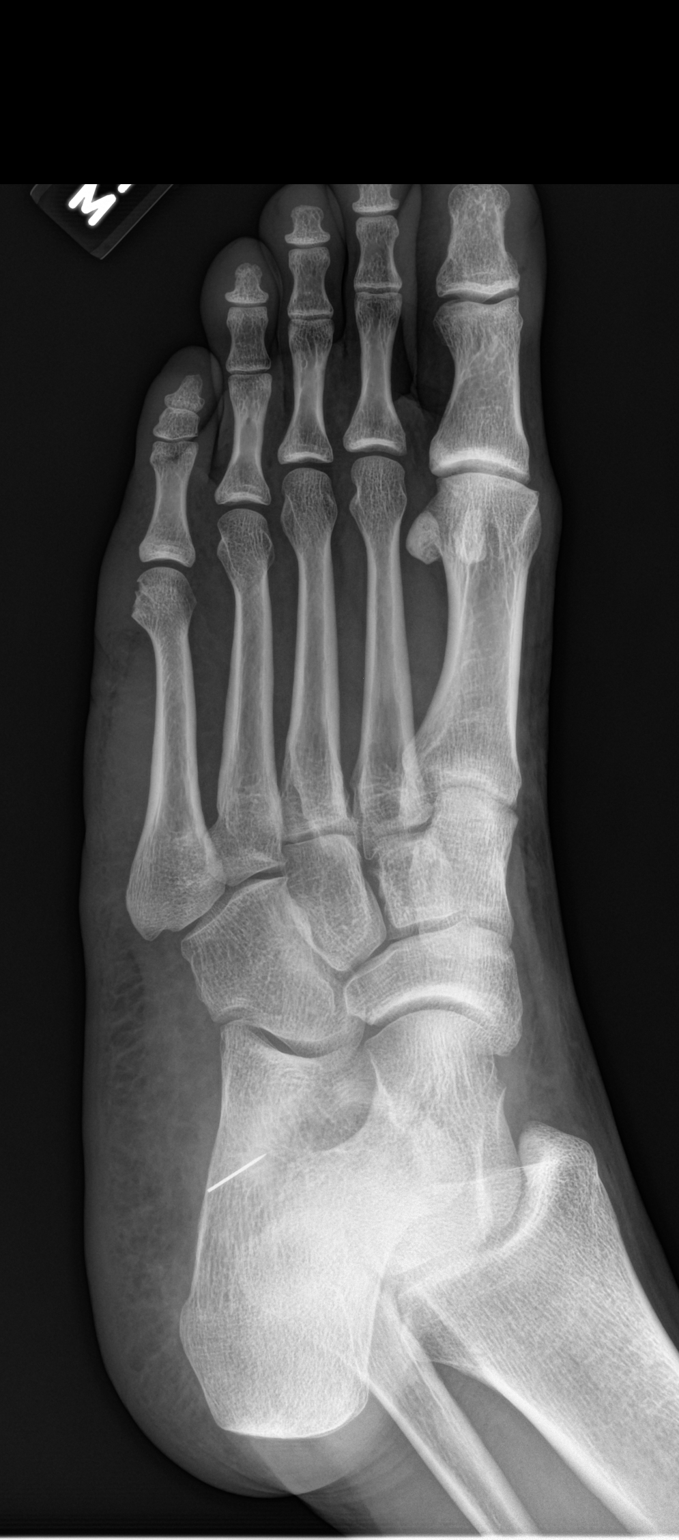

[foot lat]
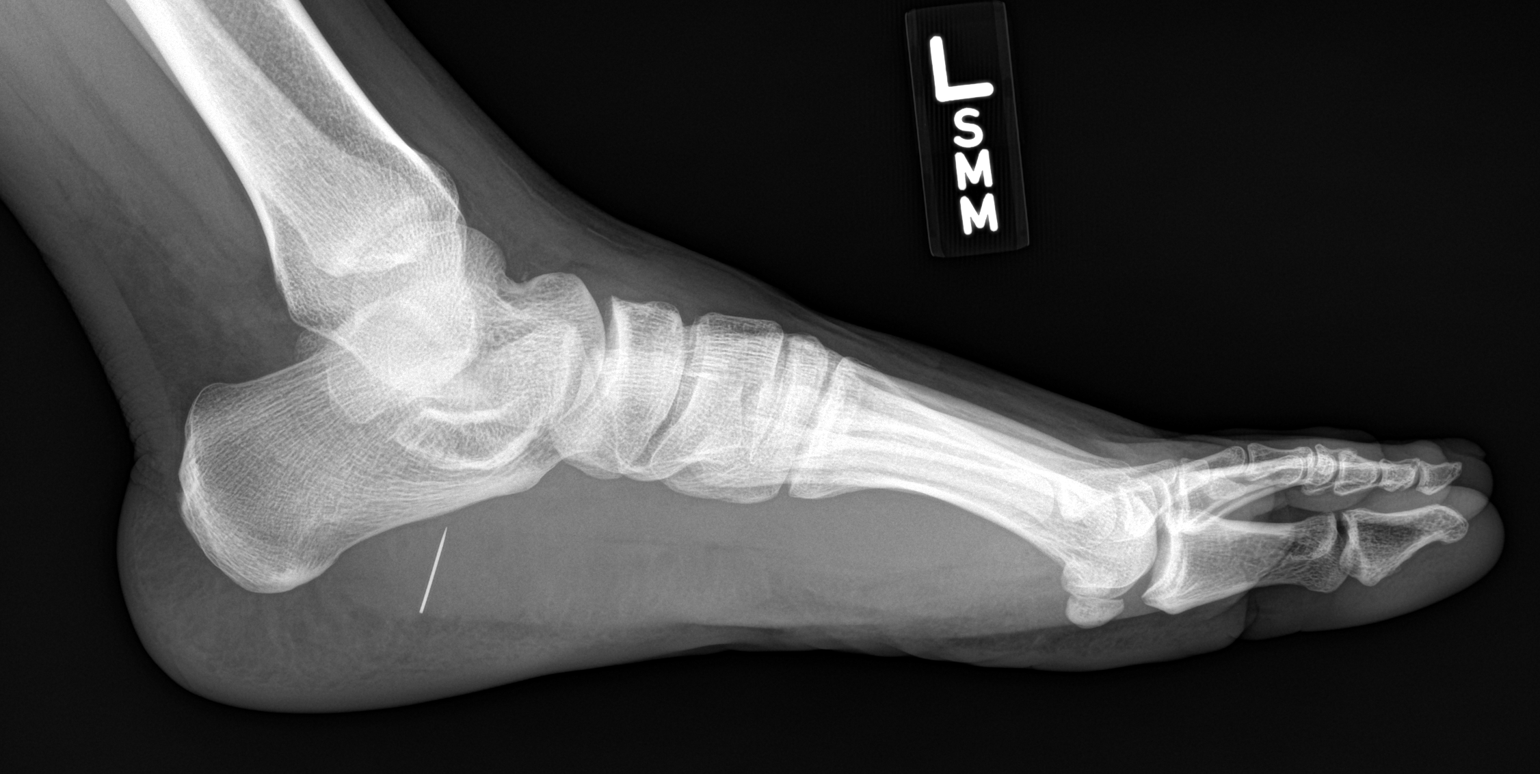

[foot ap (2 of 2)]
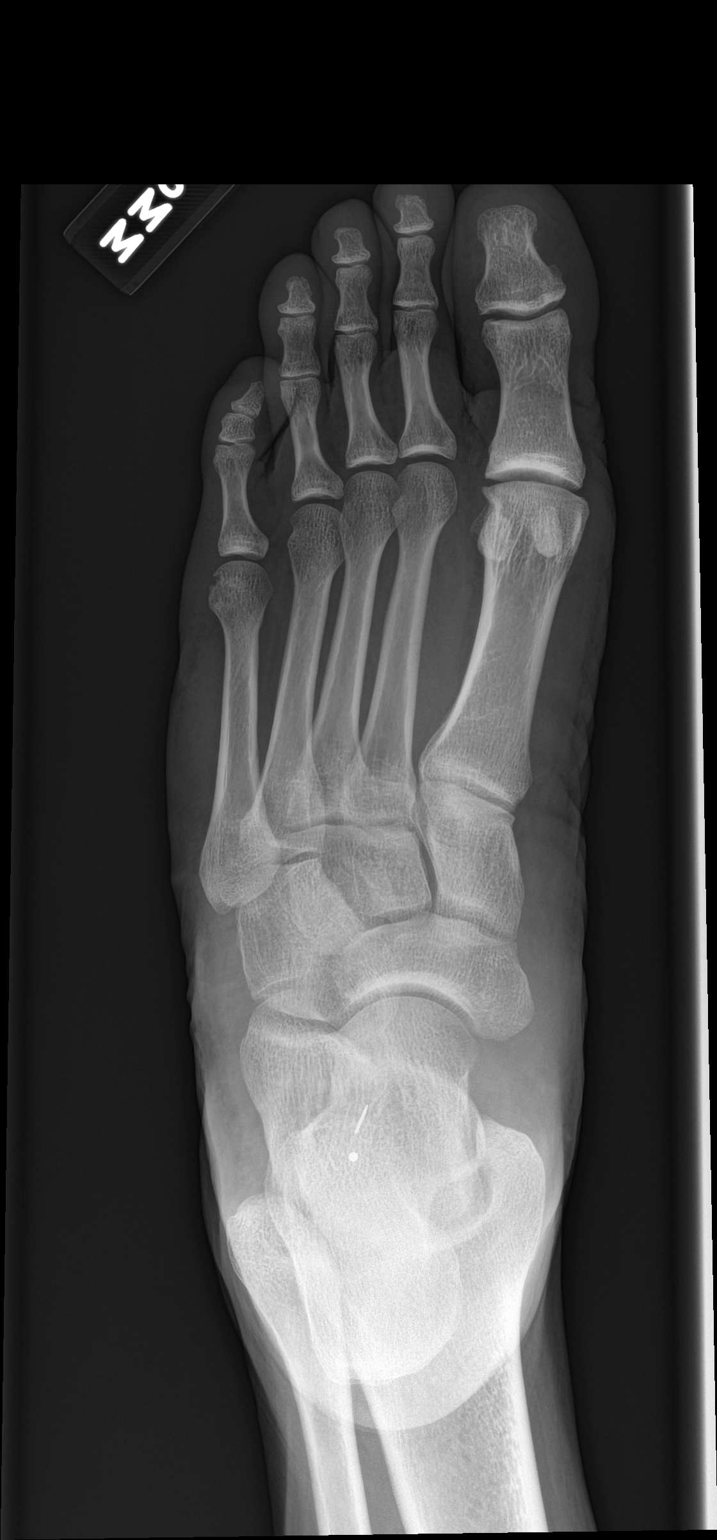

[4 of 4 positions shown; findings below may reference images not displayed]

FINDINGS: There is no evidence of a fish bone. However, there is a portion of
a needle within the soft tissues of the posterior plantar foot, just
below the body of the calcaneus.

No other radiopaque foreign body.  No soft tissue air.

No fracture. No evidence of osteomyelitis. Joints are normally
spaced and aligned. There is a small marginal erosion of the lateral
head of the fifth metacarpal. Consider gout in the proper clinical
setting. Minor spurring is noted at the first metatarsophalangeal
joint. No other arthropathic change.
IMPRESSION: 1. No fracture.  No evidence of osteomyelitis.
2. Portion of a needle lies within the posterior plantar soft
tissues below the body of the calcaneus. No other radiopaque foreign
body.

## 2016-01-10 ENCOUNTER — Other Ambulatory Visit: Payer: Self-pay | Admitting: Internal Medicine

## 2016-03-11 ENCOUNTER — Other Ambulatory Visit: Payer: Self-pay | Admitting: Internal Medicine

## 2016-03-11 DIAGNOSIS — Z8679 Personal history of other diseases of the circulatory system: Secondary | ICD-10-CM

## 2016-11-04 ENCOUNTER — Encounter: Payer: Self-pay | Admitting: Family Medicine

## 2016-11-04 ENCOUNTER — Ambulatory Visit: Payer: Self-pay | Attending: Family Medicine | Admitting: Family Medicine

## 2016-11-04 VITALS — BP 136/88 | HR 70 | Temp 98.1°F | Resp 18 | Ht 69.0 in | Wt 189.0 lb

## 2016-11-04 DIAGNOSIS — Z79899 Other long term (current) drug therapy: Secondary | ICD-10-CM | POA: Insufficient documentation

## 2016-11-04 DIAGNOSIS — H547 Unspecified visual loss: Secondary | ICD-10-CM

## 2016-11-04 DIAGNOSIS — I10 Essential (primary) hypertension: Secondary | ICD-10-CM | POA: Insufficient documentation

## 2016-11-04 MED ORDER — AMLODIPINE BESYLATE 5 MG PO TABS: 5.0000 mg | ORAL_TABLET | Freq: Every day | ORAL | 2 refills | Status: DC

## 2016-11-04 NOTE — Progress Notes (Signed)
Patient is here for f/up HTN  Patient denies pain for today  Patient reqesting physical   Patient is not taking any current medication  Patient has eaten for today

## 2016-11-04 NOTE — Progress Notes (Signed)
   Subjective:  Patient ID: Thomas Avila, male    DOB: 1961-06-12  Age: 56 y.o. MRN: [id number]  CC: Establish Care  HPI Thomas Avila presents for   Hypertension: History of hypertension. Is not currently taking anything for hypertension. Denies any chest pain shortness of breath, swelling of the bilateral lower extremities, dizziness or headaches. He does report decreased visual acuity. Reports using reading glasses to read print. Reports history ophthalmology visit less than one year ago, but reports decreased visual acuity since ophthalmology visit.    Outpatient Medications Prior to Visit  Medication Sig Dispense Refill  . hydrochlorothiazide (HYDRODIURIL) 25 MG tablet Take 1 tablet (25 mg total) by mouth daily. 90 tablet 3   No facility-administered medications prior to visit.     ROS Review of Systems  Constitutional: Negative.   Eyes: Positive for visual disturbance.  Respiratory: Negative.   Cardiovascular: Negative.   Gastrointestinal: Negative.   Skin: Negative.   Neurological: Negative.     Objective:  BP 136/88 (BP Location: Left Arm, Patient Position: Sitting, Cuff Size: Normal)   Pulse 70   Temp 98.1 F (36.7 C) (Oral)   Resp 18   Ht  (1.753 m)   Wt 189 lb (85.7 kg)   SpO2 98%   BMI 27.91 kg/m   BP/Weight 11/04/2016 03/10/2015 02/21/2015  Systolic BP 136 138 121  Diastolic BP 88 80 83  Wt. (Lbs) 189 185 -  BMI 27.91 27.31 -   Physical Exam  Eyes: Conjunctivae are normal. Pupils are equal, round, and reactive to light.  Neck: No JVD present.  Cardiovascular: Normal rate, regular rhythm, normal heart sounds and intact distal pulses.   Pulmonary/Chest: Effort normal and breath sounds normal.  Abdominal: Soft. Bowel sounds are normal.  Skin: Skin is warm and dry.  Nursing note and vitals reviewed.  Assessment & Plan:   Problem List Items Addressed This Visit      Cardiovascular and Mediastinum   Essential hypertension - Primary   Schedule BP recheck in 2 weeks with nurse.   If BP is greater than 90/60 (MAP 65 or greater) but not less than 130/80 may increase dose of    amlodipine to 10 mg and recheck in another 2 weeks.    Relevant Medications   amLODipine (NORVASC) 5 MG tablet   Other Relevant Orders   Basic metabolic panel (Completed)   Lipid Panel (Completed)   Microalbumin / creatinine urine ratio (Completed)    Other Visit Diagnoses    Decreased visual acuity       -Visual acuity screen.   Relevant Orders   Ambulatory referral to Ophthalmology      Meds ordered this encounter  Medications  . DISCONTD: amLODipine (NORVASC) 5 MG tablet    Sig: Take 1 tablet (5 mg total) by mouth daily.    Dispense:  30 tablet    Refill:  2    Order Specific Question:   Supervising Provider    Answer:   Quentin Angst [id number]  . amLODipine (NORVASC) 5 MG tablet    Sig: Take 1 tablet (5 mg total) by mouth daily.    Dispense:  30 tablet    Refill:  2    Order Specific Question:   Supervising Provider    Answer:   Quentin Angst [id number]    Follow-up: Return in about 2 weeks (around 11/18/2016) for BP check with clinic RN.  Lizbeth Bark FNP

## 2016-11-04 NOTE — Patient Instructions (Signed)
You will be called with your labs results.  Hypertension Hypertension, commonly called high blood pressure, is when the force of blood pumping through the arteries is too strong. The arteries are the blood vessels that carry blood from the heart throughout the body. Hypertension forces the heart to work harder to pump blood and may cause arteries to become narrow or stiff. Having untreated or uncontrolled hypertension can cause heart attacks, strokes, kidney disease, and other problems. A blood pressure reading consists of a higher number over a lower number. Ideally, your blood pressure should be below 120/80. The first ("top") number is called the systolic pressure. It is a measure of the pressure in your arteries as your heart beats. The second ("bottom") number is called the diastolic pressure. It is a measure of the pressure in your arteries as the heart relaxes. What are the causes? The cause of this condition is not known. What increases the risk? Some risk factors for high blood pressure are under your control. Others are not. Factors you can change   Smoking.  Having type 2 diabetes mellitus, high cholesterol, or both.  Not getting enough exercise or physical activity.  Being overweight.  Having too much fat, sugar, calories, or salt (sodium) in your diet.  Drinking too much alcohol. Factors that are difficult or impossible to change   Having chronic kidney disease.  Having a family history of high blood pressure.  Age. Risk increases with age.  Race. You may be at higher risk if you are African-American.  Gender. Men are at higher risk than women before age 55. After age 84, women are at higher risk than men.  Having obstructive sleep apnea.  Stress. What are the signs or symptoms? Extremely high blood pressure (hypertensive crisis) may cause:  Headache.  Anxiety.  Shortness of breath.  Nosebleed.  Nausea and vomiting.  Severe chest pain.  Jerky movements  you cannot control (seizures). How is this diagnosed? This condition is diagnosed by measuring your blood pressure while you are seated, with your arm resting on a surface. The cuff of the blood pressure monitor will be placed directly against the skin of your upper arm at the level of your heart. It should be measured at least twice using the same arm. Certain conditions can cause a difference in blood pressure between your right and left arms. Certain factors can cause blood pressure readings to be lower or higher than normal (elevated) for a short period of time:  When your blood pressure is higher when you are in a health care provider's office than when you are at home, this is called white coat hypertension. Most people with this condition do not need medicines.  When your blood pressure is higher at home than when you are in a health care provider's office, this is called masked hypertension. Most people with this condition may need medicines to control blood pressure. If you have a high blood pressure reading during one visit or you have normal blood pressure with other risk factors:  You may be asked to return on a different day to have your blood pressure checked again.  You may be asked to monitor your blood pressure at home for 1 week or longer. If you are diagnosed with hypertension, you may have other blood or imaging tests to help your health care provider understand your overall risk for other conditions. How is this treated? This condition is treated by making healthy lifestyle changes, such as eating healthy foods,  exercising more, and reducing your alcohol intake. Your health care provider may prescribe medicine if lifestyle changes are not enough to get your blood pressure under control, and if:  Your systolic blood pressure is above 130.  Your diastolic blood pressure is above 80. Your personal target blood pressure may vary depending on your medical conditions, your age, and  other factors. Follow these instructions at home: Eating and drinking   Eat a diet that is high in fiber and potassium, and low in sodium, added sugar, and fat. An example eating plan is called the DASH (Dietary Approaches to Stop Hypertension) diet. To eat this way:  Eat plenty of fresh fruits and vegetables. Try to fill half of your plate at each meal with fruits and vegetables.  Eat whole grains, such as whole wheat pasta, brown rice, or whole grain bread. Fill about one quarter of your plate with whole grains.  Eat or drink low-fat dairy products, such as skim milk or low-fat yogurt.  Avoid fatty cuts of meat, processed or cured meats, and poultry with skin. Fill about one quarter of your plate with lean proteins, such as fish, chicken without skin, beans, eggs, and tofu.  Avoid premade and processed foods. These tend to be higher in sodium, added sugar, and fat.  Reduce your daily sodium intake. Most people with hypertension should eat less than 1,500 mg of sodium a day.  Limit alcohol intake to no more than 1 drink a day for nonpregnant women and 2 drinks a day for men. One drink equals 12 oz of beer, 5 oz of wine, or 1 oz of hard liquor. Lifestyle   Work with your health care provider to maintain a healthy body weight or to lose weight. Ask what an ideal weight is for you.  Get at least 30 minutes of exercise that causes your heart to beat faster (aerobic exercise) most days of the week. Activities may include walking, swimming, or biking.  Include exercise to strengthen your muscles (resistance exercise), such as pilates or lifting weights, as part of your weekly exercise routine. Try to do these types of exercises for 30 minutes at least 3 days a week.  Do not use any products that contain nicotine or tobacco, such as cigarettes and e-cigarettes. If you need help quitting, ask your health care provider.  Monitor your blood pressure at home as told by your health care  provider.  Keep all follow-up visits as told by your health care provider. This is important. Medicines   Take over-the-counter and prescription medicines only as told by your health care provider. Follow directions carefully. Blood pressure medicines must be taken as prescribed.  Do not skip doses of blood pressure medicine. Doing this puts you at risk for problems and can make the medicine less effective.  Ask your health care provider about side effects or reactions to medicines that you should watch for. Contact a health care provider if:  You think you are having a reaction to a medicine you are taking.  You have headaches that keep coming back (recurring).  You feel dizzy.  You have swelling in your ankles.  You have trouble with your vision. Get help right away if:  You develop a severe headache or confusion.  You have unusual weakness or numbness.  You feel faint.  You have severe pain in your chest or abdomen.  You vomit repeatedly.  You have trouble breathing. Summary  Hypertension is when the force of blood pumping through  your arteries is too strong. If this condition is not controlled, it may put you at risk for serious complications.  Your personal target blood pressure may vary depending on your medical conditions, your age, and other factors. For most people, a normal blood pressure is less than 120/80.  Hypertension is treated with lifestyle changes, medicines, or a combination of both. Lifestyle changes include weight loss, eating a healthy, low-sodium diet, exercising more, and limiting alcohol. This information is not intended to replace advice given to you by your health care provider. Make sure you discuss any questions you have with your health care provider. Document Released: 07/01/2005 Document Revised: 05/29/2016 Document Reviewed: 05/29/2016 Elsevier Interactive Patient Education  2017 [employer].

## 2016-11-05 LAB — BASIC METABOLIC PANEL
BUN / CREAT RATIO: 11 (ref 9–20)
BUN: 13 mg/dL (ref 6–24)
CO2: 26 mmol/L (ref 18–29)
Calcium: 10.4 mg/dL — ABNORMAL HIGH (ref 8.7–10.2)
Chloride: 100 mmol/L (ref 96–106)
Creatinine, Ser: 1.15 mg/dL (ref 0.76–1.27)
GFR calc non Af Amer: 71 mL/min/{1.73_m2} (ref >59)
GFR, EST AFRICAN AMERICAN: 82 mL/min/{1.73_m2} (ref >59)
Glucose: 98 mg/dL (ref 65–99)
POTASSIUM: 5.1 mmol/L (ref 3.5–5.2)
SODIUM: 139 mmol/L (ref 134–144)

## 2016-11-05 LAB — LIPID PANEL
CHOL/HDL RATIO: 4.3 ratio (ref 0.0–5.0)
Cholesterol, Total: 249 mg/dL — ABNORMAL HIGH (ref 100–199)
HDL: 58 mg/dL (ref >39)
LDL Calculated: 150 mg/dL — ABNORMAL HIGH (ref 0–99)
Triglycerides: 204 mg/dL — ABNORMAL HIGH (ref 0–149)
VLDL Cholesterol Cal: 41 mg/dL — ABNORMAL HIGH (ref 5–40)

## 2016-11-05 LAB — MICROALBUMIN / CREATININE URINE RATIO
CREATININE, UR: 112.1 mg/dL
MICROALB/CREAT RATIO: 28.8 mg/g{creat} (ref 0.0–30.0)
Microalbumin, Urine: 32.3 ug/mL

## 2016-11-07 ENCOUNTER — Other Ambulatory Visit: Payer: Self-pay | Admitting: Family Medicine

## 2016-11-07 ENCOUNTER — Telehealth: Payer: Self-pay

## 2016-11-07 DIAGNOSIS — E782 Mixed hyperlipidemia: Secondary | ICD-10-CM

## 2016-11-07 MED ORDER — ATORVASTATIN CALCIUM 20 MG PO TABS: 20.0000 mg | ORAL_TABLET | Freq: Every day | ORAL | 2 refills | Status: DC

## 2016-11-07 NOTE — Telephone Encounter (Signed)
-----   Message from Lizbeth Bark, FNP sent at 11/07/2016  1:35 PM EDT ----- -Lipid levels were elevated. This can increase your risk of heart disease. You were prescribed atorvastatin to help lower this risk. Recommend recheck in 3 months. -Kidney function normal -Microalbumin/creatinine ratio level was normal. This tests for protein in your urine that can indicate early signs of kidney damage.  -Calcium level was elevated. Recommend scheduling lab appointment for PTH and vitamin d labs to evaluate parathyroid function.

## 2016-11-07 NOTE — Telephone Encounter (Signed)
CMA call patient regarding lab results   Patient Verify DOB   Patient was aware and understood  

## 2016-11-18 ENCOUNTER — Ambulatory Visit: Payer: Self-pay | Attending: Family Medicine | Admitting: *Deleted

## 2016-11-18 VITALS — BP 140/90 | HR 67 | Resp 16

## 2016-11-18 DIAGNOSIS — I10 Essential (primary) hypertension: Secondary | ICD-10-CM | POA: Insufficient documentation

## 2016-11-18 MED ORDER — AMLODIPINE BESYLATE 10 MG PO TABS: 10.0000 mg | ORAL_TABLET | Freq: Every day | ORAL | 3 refills | Status: DC

## 2016-11-18 NOTE — Progress Notes (Signed)
Pt arrived to [facility]  for nurse visit, BP check. Pt is alert and oriented x4. His last OV was with Larose KellsM. Hairston, FNP on 11/04/16.  Pt under impression he having a complete physical today. Pt aware appointment scheduled for blood pressure check. Pt admits that he is taking medications daily as prescribed. Pt denies chest pain, SOB, HA, new vison concerns, or generalized swelling.   Blood pressure taken manually while patient is sitting. BP: 140/90 Pt informed of target BP goal.  Medication adjustment: Amlodipine 10 mg daily. Pt encouraged to take BP reading at home and log. Pt has appointment to return to office for complete physical 5/31.

## 2016-12-12 ENCOUNTER — Encounter: Payer: Self-pay | Admitting: Family Medicine

## 2016-12-12 NOTE — Progress Notes (Deleted)
Subjective:   Patient ID: Thomas Avila, male    DOB: May 13, 1961, 56 y.o.   MRN: [id number]  No chief complaint on file.  HPI Thomas Avila 56 y.o. male presents with      Past Medical History:  Diagnosis Date  . Hypertension     No past surgical history on file.  Family History  Problem Relation Age of Onset  . Hypertension Mother     Social History   Social History  . Marital status: Married    Spouse name: N/A  . Number of children: N/A  . Years of education: N/A   Occupational History  . Not on file.   Social History Main Topics  . Smoking status: Never Smoker  . Smokeless tobacco: Not on file  . Alcohol use 0.0 oz/week     Comment: occasionally  . Drug use: No  . Sexual activity: Not on file   Other Topics Concern  . Not on file   Social History Narrative  . No narrative on file    Outpatient Medications Prior to Visit  Medication Sig Dispense Refill  . amLODipine (NORVASC) 10 MG tablet Take 1 tablet (10 mg total) by mouth daily. 90 tablet 3  . atorvastatin (LIPITOR) 20 MG tablet Take 1 tablet (20 mg total) by mouth daily. 30 tablet 2   No facility-administered medications prior to visit.     No Known Allergies  ROS     Objective:    Physical Exam  There were no vitals taken for this visit. Wt Readings from Last 3 Encounters:  11/04/16 189 lb (85.7 kg)  03/10/15 185 lb (83.9 kg)  01/02/15 186 lb 6.4 oz (84.6 kg)    Immunization History  Administered Date(s) Administered  . Tdap 11/18/2014    Diabetic Foot Exam - Simple   No data filed      Lab Results  Component Value Date   TSH 1.373 01/02/2015   Lab Results  Component Value Date   WBC 3.6 (L) 01/02/2015   HGB 14.3 01/02/2015   HCT 43.6 01/02/2015   MCV 81.2 01/02/2015   PLT 163 01/02/2015   Lab Results  Component Value Date   NA 139 11/04/2016   K 5.1 11/04/2016   CO2 26 11/04/2016   GLUCOSE 98 11/04/2016   BUN 13 11/04/2016   CREATININE 1.15  11/04/2016   BILITOT 0.6 01/02/2015   ALKPHOS 79 01/02/2015   AST 24 01/02/2015   ALT 21 01/02/2015   PROT 7.2 01/02/2015   ALBUMIN 4.5 01/02/2015   CALCIUM 10.4 (H) 11/04/2016   Lab Results  Component Value Date   CHOL 249 (H) 11/04/2016   CHOL 221 (H) 01/02/2015   Lab Results  Component Value Date   HDL 58 11/04/2016   HDL 51 01/02/2015   Lab Results  Component Value Date   LDLCALC 150 (H) 11/04/2016   LDLCALC 134 (H) 01/02/2015   Lab Results  Component Value Date   TRIG 204 (H) 11/04/2016   TRIG 180 (H) 01/02/2015   Lab Results  Component Value Date   CHOLHDL 4.3 11/04/2016   CHOLHDL 4.3 01/02/2015   Lab Results  Component Value Date   HGBA1C 5.60 01/02/2015       Assessment & Plan:   Problem List Items Addressed This Visit    None      I am having Mr. Thomas Avila maintain his atorvastatin and amLODipine.  No orders of the defined types were placed in this encounter.  Arrie Senate, FNP   `

## 2018-07-13 ENCOUNTER — Ambulatory Visit: Payer: Self-pay | Admitting: Family Medicine

## 2018-07-13 DIAGNOSIS — Z0289 Encounter for other administrative examinations: Secondary | ICD-10-CM

## 2018-07-16 ENCOUNTER — Encounter: Payer: Self-pay | Admitting: Family Medicine

## 2018-09-10 ENCOUNTER — Ambulatory Visit (INDEPENDENT_AMBULATORY_CARE_PROVIDER_SITE_OTHER): Payer: BLUE CROSS/BLUE SHIELD | Admitting: Family Medicine

## 2018-09-10 ENCOUNTER — Encounter: Payer: Self-pay | Admitting: Family Medicine

## 2018-09-10 VITALS — BP 136/88 | HR 64 | Temp 98.6°F | Ht 69.0 in | Wt 192.4 lb

## 2018-09-10 DIAGNOSIS — E785 Hyperlipidemia, unspecified: Secondary | ICD-10-CM

## 2018-09-10 DIAGNOSIS — I10 Essential (primary) hypertension: Secondary | ICD-10-CM

## 2018-09-10 DIAGNOSIS — K649 Unspecified hemorrhoids: Secondary | ICD-10-CM | POA: Diagnosis not present

## 2018-09-10 MED ORDER — HYDROCORTISONE ACETATE 25 MG RE SUPP: 25.0000 mg | Freq: Two times a day (BID) | RECTAL | 0 refills | Status: DC

## 2018-09-10 NOTE — Assessment & Plan Note (Signed)
Patient will follow-up for CPE with blood work.  Check lipid panel at that time.

## 2018-09-10 NOTE — Progress Notes (Signed)
Chief Complaint:  Thomas Avila is a 58 y.o. male who presents today with a chief complaint of anal itching and to establish care.   Assessment/Plan:  Hemorrhoids No red flags.  Recommended conservative management with good oral hydration and diet high in fiber.  Will start topical hydrocortisone for symptomatic relief.  Discussed reasons to return to care.  Essential hypertension At goal off medications.  Continue lifestyle modifications.  Discussed home blood pressure monitoring with goal 140/90 or lower.  Follow-up with me soon for CPE.  Dyslipidemia Patient will follow-up for CPE with blood work.  Check lipid panel at that time.  Preventative Healthcare Patient was instructed to return soon for CPE. Health Maintenance Due  Topic Date Due  . Hepatitis C Screening  March 25, 1961  . HIV Screening  05/15/1975  . COLONOSCOPY  05/14/2010     Subjective:  HPI:  Anal itching Symptoms started a few months ago.  Have worsened over that time.  Mostly located to the right part of the anus.  He has additionally noticed increased swelling to the area.  He is concerned about possible hemorrhoids.  He has not noticed any blood in his stool.  No melena.  No obvious precipitating events.  Tried aloe vera which did not help.  No other treatments tried.  No other obvious alleviating or aggravating factors.  His stable, chronic medical conditions are outlined below:  # Essential Hypertension - Was previously on amlodipine but has stopped over the last few months.  Home blood pressures typically in the 130s to 140s over 80s.  No reported chest pain or shortness of breath.  # Dyslipidemia - Was previously on Lipitor however is been off for the past few months.  ROS: Per HPI, otherwise a complete review of systems was negative.   PMH:  The following were reviewed and entered/updated in epic: Past Medical History:  Diagnosis Date  . Hypertension    Patient Active Problem List   Diagnosis Date Noted  . Dyslipidemia 09/10/2018  . Essential hypertension 01/02/2015   History reviewed. No pertinent surgical history.  Family History  Problem Relation Age of Onset  . Hypertension Mother   . Diabetes Mother   . Cancer Neg Hx     Medications- reviewed and updated Current Outpatient Medications  Medication Sig Dispense Refill  . hydrocortisone (ANUSOL-HC) 25 MG suppository Place 1 suppository (25 mg total) rectally 2 (two) times daily. 12 suppository 0   No current facility-administered medications for this visit.     Allergies-reviewed and updated No Known Allergies  Social History   Socioeconomic History  . Marital status: Married    Spouse name: Not on file  . Number of children: Not on file  . Years of education: Not on file  . Highest education level: Not on file  Occupational History  . Not on file  Social Needs  . Financial resource strain: Not on file  . Food insecurity:    Worry: Not on file    Inability: Not on file  . Transportation needs:    Medical: Not on file    Non-medical: Not on file  Tobacco Use  . Smoking status: Never Smoker  . Smokeless tobacco: Never Used  Substance and Sexual Activity  . Alcohol use: Not Currently    Alcohol/week: 0.0 standard drinks    Comment: occasional  . Drug use: No  . Sexual activity: Not on file  Lifestyle  . Physical activity:    Days per week: Not on  file    Minutes per session: Not on file  . Stress: Not on file  Relationships  . Social connections:    Talks on phone: Not on file    Gets together: Not on file    Attends religious service: Not on file    Active member of club or organization: Not on file    Attends meetings of clubs or organizations: Not on file    Relationship status: Not on file  Other Topics Concern  . Not on file  Social History Narrative  . Not on file         Objective:  Physical Exam: BP 136/88 (BP Location: Left Arm, Patient Position: Sitting, Cuff  Size: Normal)   Pulse 64   Temp 98.6 F (37 C) (Oral)   Ht 5\' 9"  (1.753 m)   Wt 192 lb 6.4 oz (87.3 kg)   SpO2 98%   BMI 28.41 kg/m   Gen: NAD, resting comfortably CV: Regular rate and rhythm with soft 2 out of 6 systolic murmur appreciated Pulm: Normal work of breathing, clear to auscultation bilaterally with no crackles, wheezes, or rhonchi GI: Normal bowel sounds present. Soft, Nontender, Nondistended. GU: Mild hemorrhoid noted at right anal verge. MSK: No edema, cyanosis, or clubbing noted Skin: Warm, dry Neuro: Grossly normal, moves all extremities Psych: Normal affect and thought content      Karyme Mcconathy M. Jimmey Ralph, MD 09/10/2018 4:39 PM

## 2018-09-10 NOTE — Assessment & Plan Note (Signed)
At goal off medications.  Continue lifestyle modifications.  Discussed home blood pressure monitoring with goal 140/90 or lower.  Follow-up with me soon for CPE.

## 2018-09-10 NOTE — Patient Instructions (Signed)
It was very nice to see you today!  You have a hemorrhoid.  Please make sure that you are getting plenty of fiber in your diet.  Please work your way up to 30 to 40 g of fiber daily.  You can take fiber supplements as needed.  Please use the hydrocortisone cream as needed.  We would not restart your blood pressure or cholesterol medications today.  Please keep an eye on your blood pressure and let me know if 140/90 or higher.  Please come back to see me in a few months for your physical with blood work, or sooner as needed.  Take care, Dr Jimmey Ralph

## 2019-07-02 ENCOUNTER — Other Ambulatory Visit: Payer: Self-pay

## 2019-07-02 ENCOUNTER — Encounter: Payer: Self-pay | Admitting: Family Medicine

## 2019-07-02 ENCOUNTER — Ambulatory Visit (INDEPENDENT_AMBULATORY_CARE_PROVIDER_SITE_OTHER): Payer: BC Managed Care – PPO | Admitting: Family Medicine

## 2019-07-02 VITALS — BP 144/94 | HR 93 | Temp 98.2°F | Ht 69.0 in | Wt 192.0 lb

## 2019-07-02 DIAGNOSIS — I10 Essential (primary) hypertension: Secondary | ICD-10-CM | POA: Diagnosis not present

## 2019-07-02 DIAGNOSIS — Z1211 Encounter for screening for malignant neoplasm of colon: Secondary | ICD-10-CM

## 2019-07-02 DIAGNOSIS — Z125 Encounter for screening for malignant neoplasm of prostate: Secondary | ICD-10-CM | POA: Diagnosis not present

## 2019-07-02 DIAGNOSIS — Z0001 Encounter for general adult medical examination with abnormal findings: Secondary | ICD-10-CM

## 2019-07-02 DIAGNOSIS — E663 Overweight: Secondary | ICD-10-CM

## 2019-07-02 DIAGNOSIS — H547 Unspecified visual loss: Secondary | ICD-10-CM

## 2019-07-02 DIAGNOSIS — E785 Hyperlipidemia, unspecified: Secondary | ICD-10-CM

## 2019-07-02 DIAGNOSIS — Z6828 Body mass index (BMI) 28.0-28.9, adult: Secondary | ICD-10-CM

## 2019-07-02 LAB — COMPREHENSIVE METABOLIC PANEL
ALT: 31 U/L (ref 0–53)
AST: 23 U/L (ref 0–37)
Albumin: 4.6 g/dL (ref 3.5–5.2)
Alkaline Phosphatase: 93 U/L (ref 39–117)
BUN: 16 mg/dL (ref 6–23)
CO2: 30 mEq/L (ref 19–32)
Calcium: 10 mg/dL (ref 8.4–10.5)
Chloride: 102 mEq/L (ref 96–112)
Creatinine, Ser: 1.25 mg/dL (ref 0.40–1.50)
GFR: 71.5 mL/min (ref >60.00)
Glucose, Bld: 93 mg/dL (ref 70–99)
Potassium: 4.2 mEq/L (ref 3.5–5.1)
Sodium: 138 mEq/L (ref 135–145)
Total Bilirubin: 0.6 mg/dL (ref 0.2–1.2)
Total Protein: 7.4 g/dL (ref 6.0–8.3)

## 2019-07-02 LAB — LIPID PANEL
Cholesterol: 282 mg/dL — ABNORMAL HIGH (ref 0–200)
HDL: 51.4 mg/dL (ref >39.00)
NonHDL: 230.39
Total CHOL/HDL Ratio: 5
Triglycerides: 221 mg/dL — ABNORMAL HIGH (ref 0.0–149.0)
VLDL: 44.2 mg/dL — ABNORMAL HIGH (ref 0.0–40.0)

## 2019-07-02 LAB — HEMOGLOBIN A1C: Hgb A1c MFr Bld: 5.5 % (ref 4.6–6.5)

## 2019-07-02 LAB — CBC
HCT: 47.1 % (ref 39.0–52.0)
Hemoglobin: 15.5 g/dL (ref 13.0–17.0)
MCHC: 33 g/dL (ref 30.0–36.0)
MCV: 82 fl (ref 78.0–100.0)
Platelets: 169 10*3/uL (ref 150.0–400.0)
RBC: 5.74 Mil/uL (ref 4.22–5.81)
RDW: 13.6 % (ref 11.5–15.5)
WBC: 3.4 10*3/uL — ABNORMAL LOW (ref 4.0–10.5)

## 2019-07-02 LAB — TSH: TSH: 1.5 u[IU]/mL (ref 0.35–4.50)

## 2019-07-02 LAB — PSA: PSA: 1.3 ng/mL (ref 0.10–4.00)

## 2019-07-02 LAB — LDL CHOLESTEROL, DIRECT: Direct LDL: 174 mg/dL

## 2019-07-02 NOTE — Assessment & Plan Note (Signed)
Slightly above goal.  Likely due to lifestyle changes.  Discussed lifestyle modifications including low-salt diet and at least 30 to 60 minutes of cardiovascular activity daily.  Does not want to start additional medication today.  We will continue amlodipine 10 mg daily.  He will follow-up with me in a couple of weeks

## 2019-07-02 NOTE — Patient Instructions (Signed)
It was very nice to see you today!  Please work on your diet and exercise.  Please avoid salt.  Keep an eye on your blood pressures and let me know persistently 140/90 or lower over the next couple weeks.   You can try using topical lidocaine.  Come back in 1 year for your physical.  Take care, Dr [name]  Please try these tips to maintain a healthy lifestyle:   Eat at least 3 REAL meals and 1-2 snacks per day.  Aim for no more than 5 hours between eating.  If you eat breakfast, please do so within one hour of getting up.    Each meal should contain half fruits/vegetables, one quarter protein, and one quarter carbs (no bigger than a computer mouse)   Cut down on sweet beverages. This includes juice, soda, and sweet tea.     Drink at least 1 glass of water with each meal and aim for at least 8 glasses per day   Exercise at least 150 minutes every week.    Preventive Care 70-23 Years Old, Male Preventive care refers to lifestyle choices and visits with your health care provider that can promote health and wellness. This includes:  A yearly physical exam. This is also called an annual well check.  Regular dental and eye exams.  Immunizations.  Screening for certain conditions.  Healthy lifestyle choices, such as eating a healthy diet, getting regular exercise, not using drugs or products that contain nicotine and tobacco, and limiting alcohol use. What can I expect for my preventive care visit? Physical exam Your health care provider will check:  Height and weight. These may be used to calculate body mass index (BMI), which is a measurement that tells if you are at a healthy weight.  Heart rate and blood pressure.  Your skin for abnormal spots. Counseling Your health care provider may ask you questions about:  Alcohol, tobacco, and drug use.  Emotional well-being.  Home and relationship well-being.  Sexual activity.  Eating habits.  Work and work  Statistician. What immunizations do I need?  Influenza (flu) vaccine  This is recommended every year. Tetanus, diphtheria, and pertussis (Tdap) vaccine  You may need a Td booster every 10 years. Varicella (chickenpox) vaccine  You may need this vaccine if you have not already been vaccinated. Zoster (shingles) vaccine  You may need this after age 62. Measles, mumps, and rubella (MMR) vaccine  You may need at least one dose of MMR if you were born in 1957 or later. You may also need a second dose. Pneumococcal conjugate (PCV13) vaccine  You may need this if you have certain conditions and were not previously vaccinated. Pneumococcal polysaccharide (PPSV23) vaccine  You may need one or two doses if you smoke cigarettes or if you have certain conditions. Meningococcal conjugate (MenACWY) vaccine  You may need this if you have certain conditions. Hepatitis A vaccine  You may need this if you have certain conditions or if you travel or work in places where you may be exposed to hepatitis A. Hepatitis B vaccine  You may need this if you have certain conditions or if you travel or work in places where you may be exposed to hepatitis B. Haemophilus influenzae type b (Hib) vaccine  You may need this if you have certain risk factors. Human papillomavirus (HPV) vaccine  If recommended by your health care provider, you may need three doses over 6 months. You may receive vaccines as individual doses or  as more than one vaccine together in one shot (combination vaccines). Talk with your health care provider about the risks and benefits of combination vaccines. What tests do I need? Blood tests  Lipid and cholesterol levels. These may be checked every 5 years, or more frequently if you are over 43 years old.  Hepatitis C test.  Hepatitis B test. Screening  Lung cancer screening. You may have this screening every year starting at age 50 if you have a 30-pack-year history of smoking  and currently smoke or have quit within the past 15 years.  Prostate cancer screening. Recommendations will vary depending on your family history and other risks.  Colorectal cancer screening. All adults should have this screening starting at age 29 and continuing until age 22. Your health care provider may recommend screening at age 47 if you are at increased risk. You will have tests every 1-10 years, depending on your results and the type of screening test.  Diabetes screening. This is done by checking your blood sugar (glucose) after you have not eaten for a while (fasting). You may have this done every 1-3 years.  Sexually transmitted disease (STD) testing. Follow these instructions at home: Eating and drinking  Eat a diet that includes fresh fruits and vegetables, whole grains, lean protein, and low-fat dairy products.  Take vitamin and mineral supplements as recommended by your health care provider.  Do not drink alcohol if your health care provider tells you not to drink.  If you drink alcohol: ? Limit how much you have to 0-2 drinks a day. ? Be aware of how much alcohol is in your drink. In the [place] one drink equals one 12 oz bottle of beer (355 mL), one 5 oz glass of wine (148 mL), or one 1 oz glass of hard liquor (44 mL). Lifestyle  Take daily care of your teeth and gums.  Stay active. Exercise for at least 30 minutes on 5 or more days each week.  Do not use any products that contain nicotine or tobacco, such as cigarettes, e-cigarettes, and chewing tobacco. If you need help quitting, ask your health care provider.  If you are sexually active, practice safe sex. Use a condom or other form of protection to prevent STIs (sexually transmitted infections).  Talk with your health care provider about taking a low-dose aspirin every day starting at age 55. What's next?  Go to your health care provider once a year for a well check visit.  Ask your health care provider how  often you should have your eyes and teeth checked.  Stay up to date on all vaccines. This information is not intended to replace advice given to you by your health care provider. Make sure you discuss any questions you have with your health care provider. Document Released: 07/28/2015 Document Revised: 06/25/2018 Document Reviewed: 06/25/2018 Elsevier Patient Education  2020 [employer].

## 2019-07-02 NOTE — Progress Notes (Signed)
Chief Complaint:  Thomas Avila is a 58 y.o. male who presents today for his annual comprehensive physical exam.    Assessment/Plan:  Essential hypertension Slightly above goal.  Likely due to lifestyle changes.  Discussed lifestyle modifications including low-salt diet and at least 30 to 60 minutes of cardiovascular activity daily.  Does not want to start additional medication today.  We will continue amlodipine 10 mg daily.  He will follow-up with me in a couple of weeks   Body mass index is 28.35 kg/m. / Overweight BMI Metric Follow Up - 07/02/19 1420      BMI Metric Follow Up-Please document annually   BMI Metric Follow Up  Education provided        Preventative Healthcare: Check CBC, CMET, TSH, PSA, and lipid panel.  Will place order for Cologuard.  Patient Counseling(The following topics were reviewed and/or handout was given):  -Nutrition: Stressed importance of moderation in sodium/caffeine intake, saturated fat and cholesterol, caloric balance, sufficient intake of fresh fruits, vegetables, and fiber.  -Stressed the importance of regular exercise.   -Substance Abuse: Discussed cessation/primary prevention of tobacco, alcohol, or other drug use; driving or other dangerous activities under the influence; availability of treatment for abuse.   -Injury prevention: Discussed safety belts, safety helmets, smoke detector, smoking near bedding or upholstery.   -Sexuality: Discussed sexually transmitted diseases, partner selection, use of condoms, avoidance of unintended pregnancy and contraceptive alternatives.   -Dental health: Discussed importance of regular tooth brushing, flossing, and dental visits.  -Health maintenance and immunizations reviewed. Please refer to Health maintenance section.  Return to care in 1 year for next preventative visit.     Subjective:  HPI:  He has no acute complaints today.   His stable, chronic medical conditions are outlined below:   #  Essential Hypertension - On amlodipine 10mg  daily and tolerating well -Has noticed increasing blood pressures at home for the last week or so.  Has been into the 150s and 160s.  Is having a early morning headache for the last several days.  No reported chest pain or shortness of breath.  Is taking Tylenol and Excedrin for the headache which helps.  # Dyslipidemia  Lifestyle Diet: Balanced.  Exercise: None.   Depression screen [facility] 2/9 09/10/2018  Decreased Interest 0  Down, Depressed, Hopeless 0  PHQ - 2 Score 0  Altered sleeping -  Tired, decreased energy -  Change in appetite -  Feeling bad or failure about yourself  -  Trouble concentrating -  Moving slowly or fidgety/restless -  Suicidal thoughts -  PHQ-9 Score -   Health Maintenance Due  Topic Date Due  . Hepatitis C Screening  [birth year]  . HIV Screening  05/15/1975  . COLONOSCOPY  05/14/2010     ROS: Per HPI, otherwise a complete review of systems was negative.   PMH:  The following were reviewed and entered/updated in epic: Past Medical History:  Diagnosis Date  . Hypertension    Patient Active Problem List   Diagnosis Date Noted  . Dyslipidemia 09/10/2018  . Essential hypertension 01/02/2015   History reviewed. No pertinent surgical history.  Family History  Problem Relation Age of Onset  . Hypertension Mother   . Diabetes Mother   . Cancer Neg Hx     Medications- reviewed and updated Current Outpatient Medications  Medication Sig Dispense Refill  . amLODipine (NORVASC) 10 MG tablet Take 10 mg by mouth at bedtime.     No current facility-administered medications  for this visit.    Allergies-reviewed and updated No Known Allergies  Social History   Socioeconomic History  . Marital status: Married    Spouse name: Not on file  . Number of children: Not on file  . Years of education: Not on file  . Highest education level: Not on file  Occupational History  . Not on file  Tobacco Use  .  Smoking status: Never Smoker  . Smokeless tobacco: Never Used  Substance and Sexual Activity  . Alcohol use: Not Currently    Alcohol/week: 0.0 standard drinks    Comment: occasional  . Drug use: No  . Sexual activity: Not on file  Other Topics Concern  . Not on file  Social History Narrative  . Not on file   Social Determinants of Health   Financial Resource Strain:   . Difficulty of Paying Living Expenses: Not on file  Food Insecurity:   . Worried About Charity fundraiser in the Last Year: Not on file  . Ran Out of Food in the Last Year: Not on file  Transportation Needs:   . Lack of Transportation (Medical): Not on file  . Lack of Transportation (Non-Medical): Not on file  Physical Activity:   . Days of Exercise per Week: Not on file  . Minutes of Exercise per Session: Not on file  Stress:   . Feeling of Stress : Not on file  Social Connections:   . Frequency of Communication with Friends and Family: Not on file  . Frequency of Social Gatherings with Friends and Family: Not on file  . Attends Religious Services: Not on file  . Active Member of Clubs or Organizations: Not on file  . Attends Archivist Meetings: Not on file  . Marital Status: Not on file        Objective:  Physical Exam: BP (!) 144/94   Pulse 93   Temp 98.2 F (36.8 C) (Temporal)   Ht 5\' 9"  (1.753 m)   Wt 192 lb (87.1 kg)   SpO2 98%   BMI 28.35 kg/m   Body mass index is 28.35 kg/m. Wt Readings from Last 3 Encounters:  07/02/19 192 lb (87.1 kg)  09/10/18 192 lb 6.4 oz (87.3 kg)  11/04/16 189 lb (85.7 kg)  Gen: NAD, resting comfortably HEENT: TMs normal bilaterally. OP clear. No thyromegaly noted.  CV: RRR with no murmurs appreciated Pulm: NWOB, CTAB with no crackles, wheezes, or rhonchi GI: Normal bowel sounds present. Soft, Nontender, Nondistended. MSK: no edema, cyanosis, or clubbing noted Skin: warm, dry Neuro: CN2-12 grossly intact. Strength 5/5 in upper and lower  extremities. Reflexes symmetric and intact bilaterally.  Psych: Normal affect and thought content     Railee Bonillas M. [name], MD 07/02/2019 2:27 PM

## 2019-07-05 ENCOUNTER — Other Ambulatory Visit: Payer: Self-pay

## 2019-07-05 MED ORDER — AMLODIPINE BESYLATE 10 MG PO TABS: 10.0000 mg | ORAL_TABLET | Freq: Every day | ORAL | 3 refills | Status: DC

## 2019-07-05 NOTE — Progress Notes (Signed)
Please inform patient of the following:  Cholesterol is elevated, but all of his other blood work is NORMAL. Do not recommend any medications at this point, but would like for him to keep working on diet and exercise and we can recheck in a year or so.  Thomas Avila. [name], MD 07/05/2019 8:13 AM

## 2019-09-15 ENCOUNTER — Telehealth: Payer: Self-pay

## 2019-09-15 NOTE — Telephone Encounter (Signed)
lvm regarding flu shot 

## 2019-09-15 NOTE — Telephone Encounter (Signed)
Noted  

## 2020-07-03 ENCOUNTER — Encounter: Payer: Self-pay | Admitting: Family Medicine

## 2020-07-03 ENCOUNTER — Ambulatory Visit (INDEPENDENT_AMBULATORY_CARE_PROVIDER_SITE_OTHER): Payer: BC Managed Care – PPO | Admitting: Family Medicine

## 2020-07-03 ENCOUNTER — Other Ambulatory Visit: Payer: Self-pay

## 2020-07-03 VITALS — BP 145/93 | HR 69 | Temp 98.0°F | Ht 69.0 in | Wt 193.6 lb

## 2020-07-03 DIAGNOSIS — I10 Essential (primary) hypertension: Secondary | ICD-10-CM | POA: Diagnosis not present

## 2020-07-03 DIAGNOSIS — R739 Hyperglycemia, unspecified: Secondary | ICD-10-CM

## 2020-07-03 DIAGNOSIS — E663 Overweight: Secondary | ICD-10-CM

## 2020-07-03 DIAGNOSIS — Z1211 Encounter for screening for malignant neoplasm of colon: Secondary | ICD-10-CM

## 2020-07-03 DIAGNOSIS — E785 Hyperlipidemia, unspecified: Secondary | ICD-10-CM | POA: Diagnosis not present

## 2020-07-03 DIAGNOSIS — Z0001 Encounter for general adult medical examination with abnormal findings: Secondary | ICD-10-CM | POA: Diagnosis not present

## 2020-07-03 DIAGNOSIS — Z125 Encounter for screening for malignant neoplasm of prostate: Secondary | ICD-10-CM | POA: Diagnosis not present

## 2020-07-03 DIAGNOSIS — Z6828 Body mass index (BMI) 28.0-28.9, adult: Secondary | ICD-10-CM

## 2020-07-03 MED ORDER — AMLODIPINE BESYLATE 10 MG PO TABS: 10.0000 mg | ORAL_TABLET | Freq: Every day | ORAL | 3 refills | Status: DC

## 2020-07-03 NOTE — Assessment & Plan Note (Signed)
Slightly above goal.  Has not been taking amlodipine consistently.  Discussed importance of medication adherence.  Discussed lifestyle modifications including low-sodium diet and regular exercise.  Continue home monitoring goal 140/90 or lower.

## 2020-07-03 NOTE — Patient Instructions (Signed)
It was very nice to see you today!  We will check blood work today.  Please keep up the good work with your diet and exercise.  I will see back in year for your next physical.  Please see me sooner if needed.  Take care, Dr [name]  Please try these tips to maintain a healthy lifestyle:   Eat at least 3 REAL meals and 1-2 snacks per day.  Aim for no more than 5 hours between eating.  If you eat breakfast, please do so within one hour of getting up.    Each meal should contain half fruits/vegetables, one quarter protein, and one quarter carbs (no bigger than a computer mouse)   Cut down on sweet beverages. This includes juice, soda, and sweet tea.     Drink at least 1 glass of water with each meal and aim for at least 8 glasses per day   Exercise at least 150 minutes every week.    Preventive Care 15-9 Years Old, Male Preventive care refers to lifestyle choices and visits with your health care provider that can promote health and wellness. This includes:  A yearly physical exam. This is also called an annual well check.  Regular dental and eye exams.  Immunizations.  Screening for certain conditions.  Healthy lifestyle choices, such as eating a healthy diet, getting regular exercise, not using drugs or products that contain nicotine and tobacco, and limiting alcohol use. What can I expect for my preventive care visit? Physical exam Your health care provider will check:  Height and weight. These may be used to calculate body mass index (BMI), which is a measurement that tells if you are at a healthy weight.  Heart rate and blood pressure.  Your skin for abnormal spots. Counseling Your health care provider may ask you questions about:  Alcohol, tobacco, and drug use.  Emotional well-being.  Home and relationship well-being.  Sexual activity.  Eating habits.  Work and work Statistician. What immunizations do I need?  Influenza (flu) vaccine  This is  recommended every year. Tetanus, diphtheria, and pertussis (Tdap) vaccine  You may need a Td booster every 10 years. Varicella (chickenpox) vaccine  You may need this vaccine if you have not already been vaccinated. Zoster (shingles) vaccine  You may need this after age 36. Measles, mumps, and rubella (MMR) vaccine  You may need at least one dose of MMR if you were born in 1957 or later. You may also need a second dose. Pneumococcal conjugate (PCV13) vaccine  You may need this if you have certain conditions and were not previously vaccinated. Pneumococcal polysaccharide (PPSV23) vaccine  You may need one or two doses if you smoke cigarettes or if you have certain conditions. Meningococcal conjugate (MenACWY) vaccine  You may need this if you have certain conditions. Hepatitis A vaccine  You may need this if you have certain conditions or if you travel or work in places where you may be exposed to hepatitis A. Hepatitis B vaccine  You may need this if you have certain conditions or if you travel or work in places where you may be exposed to hepatitis B. Haemophilus influenzae type b (Hib) vaccine  You may need this if you have certain risk factors. Human papillomavirus (HPV) vaccine  If recommended by your health care provider, you may need three doses over 6 months. You may receive vaccines as individual doses or as more than one vaccine together in one shot (combination vaccines). Talk with  your health care provider about the risks and benefits of combination vaccines. What tests do I need? Blood tests  Lipid and cholesterol levels. These may be checked every 5 years, or more frequently if you are over 41 years old.  Hepatitis C test.  Hepatitis B test. Screening  Lung cancer screening. You may have this screening every year starting at age 98 if you have a 30-pack-year history of smoking and currently smoke or have quit within the past 15 years.  Prostate cancer  screening. Recommendations will vary depending on your family history and other risks.  Colorectal cancer screening. All adults should have this screening starting at age 22 and continuing until age 51. Your health care provider may recommend screening at age 40 if you are at increased risk. You will have tests every 1-10 years, depending on your results and the type of screening test.  Diabetes screening. This is done by checking your blood sugar (glucose) after you have not eaten for a while (fasting). You may have this done every 1-3 years.  Sexually transmitted disease (STD) testing. Follow these instructions at home: Eating and drinking  Eat a diet that includes fresh fruits and vegetables, whole grains, lean protein, and low-fat dairy products.  Take vitamin and mineral supplements as recommended by your health care provider.  Do not drink alcohol if your health care provider tells you not to drink.  If you drink alcohol: ? Limit how much you have to 0-2 drinks a day. ? Be aware of how much alcohol is in your drink. In the [place] one drink equals one 12 oz bottle of beer (355 mL), one 5 oz glass of wine (148 mL), or one 1 oz glass of hard liquor (44 mL). Lifestyle  Take daily care of your teeth and gums.  Stay active. Exercise for at least 30 minutes on 5 or more days each week.  Do not use any products that contain nicotine or tobacco, such as cigarettes, e-cigarettes, and chewing tobacco. If you need help quitting, ask your health care provider.  If you are sexually active, practice safe sex. Use a condom or other form of protection to prevent STIs (sexually transmitted infections).  Talk with your health care provider about taking a low-dose aspirin every day starting at age 55. What's next?  Go to your health care provider once a year for a well check visit.  Ask your health care provider how often you should have your eyes and teeth checked.  Stay up to date on all  vaccines. This information is not intended to replace advice given to you by your health care provider. Make sure you discuss any questions you have with your health care provider. Document Revised: 06/25/2018 Document Reviewed: 06/25/2018 Elsevier Patient Education  2020 [employer].

## 2020-07-03 NOTE — Assessment & Plan Note (Signed)
Check lipid panel  

## 2020-07-03 NOTE — Progress Notes (Signed)
Chief Complaint:  Thomas Avila is a 59 y.o. male who presents today for his annual comprehensive physical exam.    Assessment/Plan:  Chronic Problems Addressed Today: Essential hypertension Slightly above goal.  Has not been taking amlodipine consistently.  Discussed importance of medication adherence.  Discussed lifestyle modifications including low-sodium diet and regular exercise.  Continue home monitoring goal 140/90 or lower.  Dyslipidemia Check lipid panel.   Body mass index is 28.59 kg/m. / Overweight  BMI Metric Follow Up - 07/03/20 1553      BMI Metric Follow Up-Please document annually   BMI Metric Follow Up Education provided            Preventative Healthcare: Check CBC, CMET, TSH, PSA, lipid panel.  Will place order for Cologuard.  Up-to-date on Covid vaccine.  Flu vaccine declined.  Patient Counseling(The following topics were reviewed and/or handout was given):  -Nutrition: Stressed importance of moderation in sodium/caffeine intake, saturated fat and cholesterol, caloric balance, sufficient intake of fresh fruits, vegetables, and fiber.  -Stressed the importance of regular exercise.   -Substance Abuse: Discussed cessation/primary prevention of tobacco, alcohol, or other drug use; driving or other dangerous activities under the influence; availability of treatment for abuse.   -Injury prevention: Discussed safety belts, safety helmets, smoke detector, smoking near bedding or upholstery.   -Sexuality: Discussed sexually transmitted diseases, partner selection, use of condoms, avoidance of unintended pregnancy and contraceptive alternatives.   -Dental health: Discussed importance of regular tooth brushing, flossing, and dental visits.  -Health maintenance and immunizations reviewed. Please refer to Health maintenance section.  Return to care in 1 year for next preventative visit.     Subjective:  HPI:  He has no acute complaints today.    Lifestyle Diet: Balanced. Plenty of fruits and vegetables.  Exercise: None specific.   Depression screen PHQ 2/9 07/03/2020  Decreased Interest 0  Down, Depressed, Hopeless 0  PHQ - 2 Score 0  Altered sleeping -  Tired, decreased energy -  Change in appetite -  Feeling bad or failure about yourself  -  Trouble concentrating -  Moving slowly or fidgety/restless -  Suicidal thoughts -  PHQ-9 Score -    Health Maintenance Due  Topic Date Due  . Hepatitis C Screening  Never done  . HIV Screening  Never done  . Fecal DNA (Cologuard)  Never done    ROS: Per HPI, otherwise a complete review of systems was negative.   PMH:  The following were reviewed and entered/updated in epic: Past Medical History:  Diagnosis Date  . Hypertension    Patient Active Problem List   Diagnosis Date Noted  . Dyslipidemia 09/10/2018  . Essential hypertension 01/02/2015   History reviewed. No pertinent surgical history.  Family History  Problem Relation Age of Onset  . Hypertension Mother   . Diabetes Mother   . Cancer Neg Hx     Medications- reviewed and updated Current Outpatient Medications  Medication Sig Dispense Refill  . amLODipine (NORVASC) 10 MG tablet Take 1 tablet (10 mg total) by mouth at bedtime. 90 tablet 3   No current facility-administered medications for this visit.    Allergies-reviewed and updated No Known Allergies  Social History   Socioeconomic History  . Marital status: Married    Spouse name: Not on file  . Number of children: Not on file  . Years of education: Not on file  . Highest education level: Not on file  Occupational History  . Not on  file  Tobacco Use  . Smoking status: Never Smoker  . Smokeless tobacco: Never Used  Substance and Sexual Activity  . Alcohol use: Not Currently    Alcohol/week: 0.0 standard drinks    Comment: occasional  . Drug use: No  . Sexual activity: Not on file  Other Topics Concern  . Not on file  Social  History Narrative  . Not on file   Social Determinants of Health   Financial Resource Strain: Not on file  Food Insecurity: Not on file  Transportation Needs: Not on file  Physical Activity: Not on file  Stress: Not on file  Social Connections: Not on file        Objective:  Physical Exam: BP (!) 145/93   Pulse 69   Temp 98 F (36.7 C) (Temporal)   Ht 5\' 9"  (1.753 m)   Wt 193 lb 9.6 oz (87.8 kg)   SpO2 97%   BMI 28.59 kg/m   Body mass index is 28.59 kg/m. Wt Readings from Last 3 Encounters:  07/03/20 193 lb 9.6 oz (87.8 kg)  07/02/19 192 lb (87.1 kg)  09/10/18 192 lb 6.4 oz (87.3 kg)   Gen: NAD, resting comfortably HEENT: TMs normal bilaterally. OP clear. No thyromegaly noted.  CV: RRR with no murmurs appreciated Pulm: NWOB, CTAB with no crackles, wheezes, or rhonchi GI: Normal bowel sounds present. Soft, Nontender, Nondistended. MSK: no edema, cyanosis, or clubbing noted Skin: warm, dry Neuro: CN2-12 grossly intact. Strength 5/5 in upper and lower extremities. Reflexes symmetric and intact bilaterally.  Psych: Normal affect and thought content     Madiline Saffran M. 09/12/18, MD 07/03/2020 3:53 PM

## 2020-07-04 LAB — COMPREHENSIVE METABOLIC PANEL
AG Ratio: 1.8 (calc) (ref 1.0–2.5)
ALT: 24 U/L (ref 9–46)
AST: 23 U/L (ref 10–35)
Albumin: 4.6 g/dL (ref 3.6–5.1)
Alkaline phosphatase (APISO): 88 U/L (ref 35–144)
BUN/Creatinine Ratio: 10 (calc) (ref 6–22)
BUN: 13 mg/dL (ref 7–25)
CO2: 28 mmol/L (ref 20–32)
Calcium: 9.9 mg/dL (ref 8.6–10.3)
Chloride: 104 mmol/L (ref 98–110)
Creat: 1.35 mg/dL — ABNORMAL HIGH (ref 0.70–1.25)
Globulin: 2.6 g/dL (calc) (ref 1.9–3.7)
Glucose, Bld: 79 mg/dL (ref 65–99)
Potassium: 4.6 mmol/L (ref 3.5–5.3)
Sodium: 139 mmol/L (ref 135–146)
Total Bilirubin: 0.6 mg/dL (ref 0.2–1.2)
Total Protein: 7.2 g/dL (ref 6.1–8.1)

## 2020-07-04 LAB — LIPID PANEL
Cholesterol: 246 mg/dL — ABNORMAL HIGH (ref <200)
HDL: 55 mg/dL (ref >=40)
LDL Cholesterol (Calc): 160 mg/dL (calc) — ABNORMAL HIGH
Non-HDL Cholesterol (Calc): 191 mg/dL (calc) — ABNORMAL HIGH (ref <130)
Total CHOL/HDL Ratio: 4.5 (calc) (ref <5.0)
Triglycerides: 159 mg/dL — ABNORMAL HIGH (ref <150)

## 2020-07-04 LAB — HEMOGLOBIN A1C
Hgb A1c MFr Bld: 5.9 % of total Hgb — ABNORMAL HIGH (ref <5.7)
Mean Plasma Glucose: 123 mg/dL
eAG (mmol/L): 6.8 mmol/L

## 2020-07-04 LAB — CBC
HCT: 45.8 % (ref 38.5–50.0)
Hemoglobin: 14.9 g/dL (ref 13.2–17.1)
MCH: 27.1 pg (ref 27.0–33.0)
MCHC: 32.5 g/dL (ref 32.0–36.0)
MCV: 83.4 fL (ref 80.0–100.0)
MPV: 9.8 fL (ref 7.5–12.5)
Platelets: 176 10*3/uL (ref 140–400)
RBC: 5.49 10*6/uL (ref 4.20–5.80)
RDW: 13.9 % (ref 11.0–15.0)
WBC: 4 10*3/uL (ref 3.8–10.8)

## 2020-07-04 LAB — PSA: PSA: 1.09 ng/mL (ref <=4.0)

## 2020-07-04 LAB — TSH: TSH: 2.11 mIU/L (ref 0.40–4.50)

## 2020-07-04 NOTE — Progress Notes (Signed)
Please inform patient of the following:  Blood sugar and cholesterol are both borderline. Cholesterol is slightly better than last year. Everything else is stable. Do not need to make any changes to his treatment plan at this time. Would like for him to keep working on diet and exercise and we can recheck in a year or so.  Katina Degree. Jimmey Ralph, MD 07/04/2020 8:03 AM

## 2020-07-10 ENCOUNTER — Encounter: Payer: Self-pay | Admitting: Family Medicine

## 2020-07-10 DIAGNOSIS — Z1211 Encounter for screening for malignant neoplasm of colon: Secondary | ICD-10-CM | POA: Diagnosis not present

## 2020-07-10 LAB — COLOGUARD: Cologuard: NEGATIVE

## 2020-07-18 LAB — COLOGUARD
COLOGUARD: NEGATIVE
Cologuard: NEGATIVE

## 2020-07-19 ENCOUNTER — Encounter: Payer: Self-pay | Admitting: Family Medicine

## 2022-04-17 ENCOUNTER — Ambulatory Visit: Payer: BC Managed Care – PPO | Admitting: Family Medicine

## 2022-04-18 ENCOUNTER — Encounter: Payer: Self-pay | Admitting: Family Medicine

## 2022-04-18 ENCOUNTER — Ambulatory Visit (INDEPENDENT_AMBULATORY_CARE_PROVIDER_SITE_OTHER): Payer: Self-pay | Admitting: Family Medicine

## 2022-04-18 VITALS — BP 134/86 | HR 75 | Temp 98.0°F | Ht 69.0 in | Wt 182.0 lb

## 2022-04-18 DIAGNOSIS — I10 Essential (primary) hypertension: Secondary | ICD-10-CM

## 2022-04-18 MED ORDER — AMLODIPINE BESYLATE 10 MG PO TABS: 10.0000 mg | ORAL_TABLET | Freq: Every day | ORAL | 3 refills | Status: DC

## 2022-04-18 NOTE — Progress Notes (Signed)
   Thomas Avila is a 61 y.o. male who presents today for an office visit.  Assessment/Plan:  New/Acute Problems: Paresthesias No red flags.  Possibly related to his recent work injury.  It is reassuring that he has not had any symptoms today and his exam is reassuring.  We will continue with watchful waiting for another few weeks.  If symptoms persist would consider referral to sports medicine.  Chronic Problems Addressed Today: Essential hypertension Slightly above goal today though his home readings the last few weeks have been significantly elevated. We will restart his amlodipine 10mg  daily. He will monitor at home.      Subjective:  HPI:  See A/p for status of chronic conditions.  His main concern today is his blood pressure. He has been out of his amlodipine for a few months. He recently had an employment health screening and was found to have a BP into the 657Q systolic and they directed him to follow up here. He is having occasional headaches and dizziness.   He has also been having some numbness in left arm for the last month wor so. This happens sporadically and intermittently.  Last for few moments and then subsides.  He did have an injury at work about a month ago when he was reaching out and felt a strain to the bottom part of his left upper arm.  He is not sure if this is causing his symptoms.       Objective:  Physical Exam: BP (!) 140/95   Pulse 75   Temp 98 F (36.7 C) (Temporal)   Ht 5\' 9"  (1.753 m)   Wt 182 lb (82.6 kg)   SpO2 99%   BMI 26.88 kg/m   Gen: No acute distress, resting comfortably CV: Regular rate and rhythm with no murmurs appreciated Pulm: Normal work of breathing, clear to auscultation bilaterally with no crackles, wheezes, or rhonchi MSK: Left Arm: No deformities.  Sensation light touch intact throughout.  Strength intact throughout.  Negative Tinel sign at wrist and medial epicondyle. Neuro: Grossly normal, moves all extremities Psych:  Normal affect and thought content      Erikah Thumm M. [name], MD 04/18/2022 7:52 AM

## 2022-04-18 NOTE — Assessment & Plan Note (Signed)
Slightly above goal today though his home readings the last few weeks have been significantly elevated. We will restart his amlodipine 10mg  daily. He will monitor at home.

## 2022-04-18 NOTE — Patient Instructions (Signed)
It was very nice to see you today!  We will restart your pain.  Keep an eye on your blood pressure and let us know if persistently elevated.  Please come back soon for your annual physical with labs.  Take care, Dr [name]  PLEASE NOTE:  If you had any lab tests please let us know if you have not heard back within a few days. You may see your results on mychart before we have a chance to review them but we will give you a call once they are reviewed by [place]. If we ordered any referrals today, please let us know if you have not heard from their office within the next week.   Please try these tips to maintain a healthy lifestyle:  Eat at least 3 REAL meals and 1-2 snacks per day.  Aim for no more than 5 hours between eating.  If you eat breakfast, please do so within one hour of getting up.   Each meal should contain half fruits/vegetables, one quarter protein, and one quarter carbs (no bigger than a computer mouse)  Cut down on sweet beverages. This includes juice, soda, and sweet tea.   Drink at least 1 glass of water with each meal and aim for at least 8 glasses per day  Exercise at least 150 minutes every week.

## 2022-05-08 ENCOUNTER — Encounter: Payer: Self-pay | Admitting: Family Medicine

## 2022-05-21 ENCOUNTER — Other Ambulatory Visit: Payer: Self-pay | Admitting: *Deleted

## 2022-11-26 ENCOUNTER — Telehealth: Payer: Self-pay | Admitting: Family Medicine

## 2022-11-26 NOTE — Telephone Encounter (Signed)
error 

## 2023-01-15 LAB — LAB REPORT - SCANNED
A1c: 5.7
EGFR: 65

## 2023-01-17 ENCOUNTER — Encounter: Payer: Self-pay | Admitting: Family Medicine

## 2023-02-10 ENCOUNTER — Other Ambulatory Visit: Payer: Self-pay | Admitting: Family Medicine

## 2023-03-28 ENCOUNTER — Encounter: Payer: Self-pay | Admitting: Family Medicine

## 2023-03-28 ENCOUNTER — Ambulatory Visit (INDEPENDENT_AMBULATORY_CARE_PROVIDER_SITE_OTHER): Payer: Self-pay | Admitting: Family

## 2023-03-28 VITALS — BP 129/85 | HR 70 | Temp 98.0°F | Ht 69.0 in | Wt 186.4 lb

## 2023-03-28 DIAGNOSIS — I10 Essential (primary) hypertension: Secondary | ICD-10-CM

## 2023-03-28 MED ORDER — LOSARTAN POTASSIUM 50 MG PO TABS
75.0000 mg | ORAL_TABLET | ORAL | 3 refills | Status: DC
Start: 2023-03-28 — End: 2023-04-02

## 2023-03-28 NOTE — Assessment & Plan Note (Addendum)
Chronic, stable Taking Losartan 50mg  qam, states he stopped the Amlodipine about 1.5-46m ago due to ankle swelling reports he went to UC d/t swelling and had labs done, he will get [place] a copy he reports home readings have never been below 135 systolic and 80-90 diastolic, a few readings just over a 100 advised to increase his dose to 1/2 pill in the evening He will continue to monitor at home and let us know if still not in normal range f/u 65yr or prn w/PCP

## 2023-03-28 NOTE — Progress Notes (Signed)
Patient ID: Thomas Avila, male    DOB: [birth year], 62 y.o.   MRN: [id number]  Chief Complaint  Patient presents with   Hypertension    HPI: Hypertension: Patient is currently maintained on the following medications for blood pressure: Losartan 50mg  daily. Failed meds include: Amlodipine caused ankle swelling, so he stopped taking. Patient reports good compliance with blood pressure medications. Patient denies chest pain, headaches, shortness of breath or swelling. Last 3 blood pressure readings in our office are as follows: BP Readings from Last 3 Encounters:  03/28/23 129/85  04/18/22 134/86  07/03/20 (!) 145/93     Assessment & Plan:  Essential hypertension Assessment & Plan: Chronic, stable Taking Losartan 50mg  qam, states he stopped the Amlodipine about 1.5-5m ago due to ankle swelling reports he went to UC d/t swelling and had labs done, he will get [place] a copy he reports home readings have never been below 135 systolic and 80-90 diastolic, a few readings just over a 100 advised to increase his dose to 1/2 pill in the evening He will continue to monitor at home and let us know if still not in normal range f/u 67yr or prn w/PCP  Orders: -     Losartan Potassium; Take 1.5 tablets (75 mg total) by mouth as directed. Take 1 pill qam and 1/2 pill qpm.  Dispense: 135 tablet; Refill: 3    Subjective:    Outpatient Medications Prior to Visit  Medication Sig Dispense Refill   amLODipine (NORVASC) 10 MG tablet Take 1 tablet (10 mg total) by mouth at bedtime. 90 tablet 3   losartan (COZAAR) 50 MG tablet Take 50 mg by mouth daily.     No facility-administered medications prior to visit.   Past Medical History:  Diagnosis Date   Hypertension    No past surgical history on file. No Known Allergies    Objective:    Physical Exam Vitals and nursing note reviewed.  Constitutional:      General: He is not in acute distress.    Appearance: Normal appearance.  HENT:      Head: Normocephalic.  Cardiovascular:     Rate and Rhythm: Normal rate and regular rhythm.  Pulmonary:     Effort: Pulmonary effort is normal.     Breath sounds: Normal breath sounds.  Musculoskeletal:        General: Normal range of motion.     Cervical back: Normal range of motion.  Skin:    General: Skin is warm and dry.  Neurological:     Mental Status: He is alert and oriented to person, place, and time.  Psychiatric:        Mood and Affect: Mood normal.    BP 129/85 (BP Location: Left Arm, Patient Position: Sitting, Cuff Size: Large)   Pulse 70   Temp 98 F (36.7 C) (Temporal)   Ht 5\' 9"  (1.753 m)   Wt 186 lb 6.4 oz (84.6 kg)   SpO2 100%   BMI 27.53 kg/m  Wt Readings from Last 3 Encounters:  03/28/23 186 lb 6.4 oz (84.6 kg)  04/18/22 182 lb (82.6 kg)  07/03/20 193 lb 9.6 oz (87.8 kg)       Dulce Sellar, NP

## 2023-03-28 NOTE — Patient Instructions (Addendum)
It was very nice to see you today!   I have sent in your refill for Losartan 50mg . Increase your dose to 75mg  daily. Take 1 pill with breakfast and 1/2 pill at supper or bedtime. Your ideal reading is < 105-120/60-80. Remember to drink at least 2 liters = 8 8oz cups of water daily. Try to get in extra cardio exercise when you can. Eat a low sodium diet. Continue to manage your weight, do not gain any!  Have a great weekend :-)      PLEASE NOTE:  If you had any lab tests please let us know if you have not heard back within a few days. You may see your results on MyChart before we have a chance to review them but we will give you a call once they are reviewed by [place]. If we ordered any referrals today, please let us know if you have not heard from their office within the next week.

## 2023-04-02 ENCOUNTER — Other Ambulatory Visit: Payer: Self-pay

## 2023-04-02 ENCOUNTER — Telehealth: Payer: Self-pay | Admitting: Family Medicine

## 2023-04-02 DIAGNOSIS — I10 Essential (primary) hypertension: Secondary | ICD-10-CM

## 2023-04-02 MED ORDER — LOSARTAN POTASSIUM 50 MG PO TABS: 75.0000 mg | ORAL_TABLET | ORAL | 3 refills | Status: AC

## 2023-04-02 NOTE — Telephone Encounter (Signed)
Patient called stating he has been to the pharmacy twice and they are informing him they don't see rx order for the losartan. Patient is asking it be resent to:  [id number] [place], [place] - [address] AT [facility] [address] & [facility] [address] [place] [address] Phone: [phone] Fax: [phone]

## 2023-04-02 NOTE — Telephone Encounter (Signed)
RX Resent

## 2023-04-07 ENCOUNTER — Encounter: Payer: Self-pay | Admitting: Family

## 2023-04-07 NOTE — Telephone Encounter (Signed)
Patient called stating that pharmacy is still saying they don't see the prescription order. I confirmed the location with the patient and informed him that the pharmacy has confirmed receiving this rx order both times it's been sent in. Patient verbalized understanding and states he will have pharmacy call in.

## 2023-04-08 NOTE — Telephone Encounter (Signed)
I called and spoke with pharmacy, pt picked up RX.

## 2023-09-25 ENCOUNTER — Encounter: Payer: Self-pay | Admitting: Family Medicine
